# Patient Record
Sex: Male | Born: 1975
Health system: Southern US, Community
[De-identification: ages and names within clinical notes are randomized; demographics above are authoritative.]

## PROBLEM LIST (undated history)

## (undated) DIAGNOSIS — R319 Hematuria, unspecified: Secondary | ICD-10-CM

## (undated) DIAGNOSIS — D494 Neoplasm of unspecified behavior of bladder: Secondary | ICD-10-CM

## (undated) DIAGNOSIS — E119 Type 2 diabetes mellitus without complications: Secondary | ICD-10-CM

## (undated) DIAGNOSIS — D573 Sickle-cell trait: Secondary | ICD-10-CM

## (undated) DIAGNOSIS — T8859XA Other complications of anesthesia, initial encounter: Secondary | ICD-10-CM

## (undated) DIAGNOSIS — I10 Essential (primary) hypertension: Secondary | ICD-10-CM

## (undated) DIAGNOSIS — T4145XA Adverse effect of unspecified anesthetic, initial encounter: Secondary | ICD-10-CM

## (undated) DIAGNOSIS — G4730 Sleep apnea, unspecified: Secondary | ICD-10-CM

## (undated) DIAGNOSIS — K219 Gastro-esophageal reflux disease without esophagitis: Secondary | ICD-10-CM

## (undated) HISTORY — PX: CARDIAC CATHETERIZATION: SHX172

---

## 2006-09-02 HISTORY — PX: TONSILLECTOMY: SUR1361

## 2008-09-02 HISTORY — PX: TRANSURETHRAL RESECTION OF BLADDER TUMOR WITH GYRUS (TURBT-GYRUS): SHX6458

## 2014-01-04 ENCOUNTER — Other Ambulatory Visit: Payer: Self-pay | Admitting: Urology

## 2014-01-11 ENCOUNTER — Encounter (HOSPITAL_COMMUNITY): Payer: Self-pay | Admitting: Pharmacy Technician

## 2014-01-13 ENCOUNTER — Emergency Department (HOSPITAL_COMMUNITY): Payer: Managed Care, Other (non HMO)

## 2014-01-13 ENCOUNTER — Encounter (HOSPITAL_COMMUNITY): Payer: Self-pay

## 2014-01-13 ENCOUNTER — Encounter (HOSPITAL_COMMUNITY): Payer: Self-pay | Admitting: Emergency Medicine

## 2014-01-13 ENCOUNTER — Other Ambulatory Visit (HOSPITAL_COMMUNITY): Payer: Self-pay | Admitting: *Deleted

## 2014-01-13 ENCOUNTER — Encounter (INDEPENDENT_AMBULATORY_CARE_PROVIDER_SITE_OTHER): Payer: Self-pay

## 2014-01-13 ENCOUNTER — Encounter (HOSPITAL_COMMUNITY)
Admission: RE | Admit: 2014-01-13 | Discharge: 2014-01-13 | Disposition: A | Discharge status: Home / Self Care | Payer: Managed Care, Other (non HMO) | Source: Ambulatory Visit | Attending: Urology | Admitting: Urology

## 2014-01-13 ENCOUNTER — Emergency Department (HOSPITAL_COMMUNITY)
Admission: EM | Admit: 2014-01-13 | Discharge: 2014-01-13 | Disposition: A | Discharge status: Home / Self Care | Payer: Managed Care, Other (non HMO) | Attending: Emergency Medicine | Admitting: Emergency Medicine

## 2014-01-13 DIAGNOSIS — Z79899 Other long term (current) drug therapy: Secondary | ICD-10-CM | POA: Insufficient documentation

## 2014-01-13 DIAGNOSIS — I10 Essential (primary) hypertension: Secondary | ICD-10-CM

## 2014-01-13 DIAGNOSIS — Z8719 Personal history of other diseases of the digestive system: Secondary | ICD-10-CM | POA: Insufficient documentation

## 2014-01-13 DIAGNOSIS — G4730 Sleep apnea, unspecified: Secondary | ICD-10-CM | POA: Insufficient documentation

## 2014-01-13 DIAGNOSIS — R350 Frequency of micturition: Secondary | ICD-10-CM | POA: Insufficient documentation

## 2014-01-13 DIAGNOSIS — R109 Unspecified abdominal pain: Secondary | ICD-10-CM | POA: Insufficient documentation

## 2014-01-13 HISTORY — DX: Neoplasm of unspecified behavior of bladder: D49.4

## 2014-01-13 HISTORY — DX: Hematuria, unspecified: R31.9

## 2014-01-13 HISTORY — DX: Essential (primary) hypertension: I10

## 2014-01-13 HISTORY — DX: Other complications of anesthesia, initial encounter: T88.59XA

## 2014-01-13 HISTORY — DX: Gastro-esophageal reflux disease without esophagitis: K21.9

## 2014-01-13 HISTORY — DX: Sleep apnea, unspecified: G47.30

## 2014-01-13 HISTORY — DX: Adverse effect of unspecified anesthetic, initial encounter: T41.45XA

## 2014-01-13 LAB — CBC
HCT: 45.6 % (ref 39.0–52.0)
Hemoglobin: 16.7 g/dL (ref 13.0–17.0)
MCH: 26.9 pg (ref 26.0–34.0)
MCHC: 36.6 g/dL — ABNORMAL HIGH (ref 30.0–36.0)
MCV: 73.4 fL — ABNORMAL LOW (ref 78.0–100.0)
Platelets: 292 10*3/uL (ref 150–400)
RBC: 6.21 MIL/uL — AB (ref 4.22–5.81)
RDW: 14.8 % (ref 11.5–15.5)
WBC: 6.7 10*3/uL (ref 4.0–10.5)

## 2014-01-13 LAB — BASIC METABOLIC PANEL
BUN: 14 mg/dL (ref 6–23)
CO2: 26 mEq/L (ref 19–32)
Calcium: 9.9 mg/dL (ref 8.4–10.5)
Chloride: 101 mEq/L (ref 96–112)
Creatinine, Ser: 1.36 mg/dL — ABNORMAL HIGH (ref 0.50–1.35)
GFR, EST AFRICAN AMERICAN: 74 mL/min — AB (ref >90)
GFR, EST NON AFRICAN AMERICAN: 64 mL/min — AB (ref >90)
Glucose, Bld: 85 mg/dL (ref 70–99)
Potassium: 4.4 mEq/L (ref 3.7–5.3)
SODIUM: 138 meq/L (ref 137–147)

## 2014-01-13 MED ORDER — GENTAMICIN SULFATE 40 MG/ML IJ SOLN
560.0000 mg | INTRAVENOUS | Status: DC
  Filled 2014-01-13: qty 14

## 2014-01-13 MED ORDER — HYDRALAZINE HCL 20 MG/ML IJ SOLN
10.0000 mg | Freq: Once | INTRAMUSCULAR | Status: AC
  Administered 2014-01-13: 10 mg via INTRAVENOUS
  Filled 2014-01-13: qty 0.5

## 2014-01-13 MED ORDER — METOPROLOL TARTRATE 25 MG PO TABS
25.0000 mg | ORAL_TABLET | Freq: Once | ORAL | Status: AC
  Administered 2014-01-13: 25 mg via ORAL
  Filled 2014-01-13: qty 1

## 2014-01-13 NOTE — ED Notes (Addendum)
Pt was being prep for surgery today and in peri-op that noted pt of having HTN. Pt has HTN and took BP meds about 3 hours ago. BP noted was 212/134 and 206/136. Staff did EKG, drew CBC and BMET. Pt denies chest pain or headaches. Wants pt evaluated

## 2014-01-13 NOTE — Progress Notes (Signed)
12:20 PM pt taken to ED at request of Dr. Marcell Barlow when BP was reported to be 206/136 on arrival to PST and at recheck to be 212/134. Pt was alert with discomfort in bladder area no acute distress. Hx obtained / instructions given/  EKG / LABS at PST for surgical procedure in AM.

## 2014-01-13 NOTE — Patient Instructions (Addendum)
Lissandro Oncale  01/13/2014                           YOUR PROCEDURE IS SCHEDULED ON: 01/14/14 AT 10:00 AM               ENTER [place] ENTRANCE AND                                       FOLLOW  SIGNS TO SHORT STAY CENTER                 ARRIVE AT SHORT STAY AT: 8:00 AM               CALL THIS NUMBER IF ANY PROBLEMS THE DAY OF SURGERY :               [phone]                  BRING C PAP MASK AND TUBING                             REMEMBER:   Do not eat food or drink liquids AFTER MIDNIGHT                 Take these medicines the morning of surgery with               A SIPS OF WATER :      AMLODIPINE / METOPROLOL / MINOXIDIL   Do not wear jewelry, make-up   Do not wear lotions, powders, or perfumes.   Do not shave legs or underarms 12 hrs. before surgery (men may shave face)  Do not bring valuables to the hospital.  Contacts, dentures or bridgework may not be worn into surgery.  Leave suitcase in the car. After surgery it may be brought to your room.  For patients admitted to the hospital more than one night, checkout time is            11:00 AM                                                       The day of discharge.   Patients discharged the day of surgery will not be allowed to drive home.            If going home same day of surgery, must have someone stay with you              FIRST 24 hrs at home and arrange for some one to drive you              home from hospital.   ________________________________________________________________________                                                          [facility] - Preparing for Surgery Before surgery, you can play an important role.  Because skin is not sterile, your skin needs  to be as free of germs as possible.  You can reduce the number of germs on your skin by washing with CHG (chlorahexidine gluconate) soap before surgery.  CHG is an antiseptic cleaner which kills germs and bonds  with the skin to continue killing germs even after washing. Please DO NOT use if you have an allergy to CHG or antibacterial soaps.  If your skin becomes reddened/irritated stop using the CHG and inform your nurse when you arrive at Short Stay. Do not shave (including legs and underarms) for at least 48 hours prior to the first CHG shower.  You may shave your face. Please follow these instructions carefully:  1.  Shower with CHG Soap the night before surgery and the  morning of Surgery.   2.  If you choose to wash your hair, wash your hair first as usual with your  normal  Shampoo.   3.  After you shampoo, rinse your hair and body thoroughly to remove the  shampoo.                                         4.  Use CHG as you would any other liquid soap.  You can apply chg directly  to the skin and wash . Gently wash with scrungie or clean wascloth    5.  Apply the CHG Soap to your body ONLY FROM THE NECK DOWN.   Do not use on open                           Wound or open sores. Avoid contact with eyes, ears mouth and genitals (private parts).                        Genitals (private parts) with your normal soap.              6.  Wash thoroughly, paying special attention to the area where your surgery  will be performed.   7.  Thoroughly rinse your body with warm water from the neck down.   8.  DO NOT shower/wash with your normal soap after using and rinsing off  the CHG Soap .                9.  Pat yourself dry with a clean towel.             10.  Wear clean pajamas.             11.  Place clean sheets on your bed the night of your first shower and do not  sleep with pets.  Day of Surgery : Do not apply any lotions/deodorants the morning of surgery.  Please wear clean clothes to the hospital/surgery center.  FAILURE TO FOLLOW THESE INSTRUCTIONS MAY RESULT IN THE CANCELLATION OF YOUR SURGERY    PATIENT SIGNATURE_________________________________

## 2014-01-13 NOTE — ED Provider Notes (Signed)
CSN: [id number]     Arrival date & time 01/13/14  1229 History   First MD Initiated Contact with Patient 01/13/14 1238     Chief Complaint  Patient presents with  . Hypertension     (Consider location/radiation/quality/duration/timing/severity/associated sxs/prior Treatment) Patient is a 38 y.o. male presenting with hypertension. The history is provided by the patient. No language interpreter was used.  Hypertension This is a recurrent problem. Episode onset: unsure. Episode frequency: unsure. Progression since onset: worsening today. Associated symptoms include abdominal pain (has known bladder tumor which is painful. ). Pertinent negatives include no chest pain, no headaches and no shortness of breath. Nothing aggravates the symptoms. Nothing relieves the symptoms. Treatments tried: None. The treatment provided no relief.    Past Medical History  Diagnosis Date  . Complication of anesthesia     SLIGHT NAUSEA  . Hypertension   . GERD (gastroesophageal reflux disease)     RELATED TO CERTAIN FOODS  . Bladder tumor   . Hematuria   . Sleep apnea     USES C PAP   Past Surgical History  Procedure Laterality Date  . Transurethral resection of bladder tumor with gyrus (turbt-gyrus)  2013  . Tonsillectomy  2008   No family history on file. History  Substance Use Topics  . Smoking status: Never Smoker   . Smokeless tobacco: Not on file  . Alcohol Use: Yes     Comment: OCCASIONAL    Review of Systems  Constitutional: Negative for fever, activity change, appetite change and fatigue.  HENT: Negative for congestion, facial swelling, rhinorrhea and trouble swallowing.   Eyes: Negative for photophobia and pain.  Respiratory: Negative for cough, chest tightness and shortness of breath.   Cardiovascular: Negative for chest pain and leg swelling.  Gastrointestinal: Positive for abdominal pain (has known bladder tumor which is painful. ). Negative for nausea, vomiting, diarrhea and  constipation.  Endocrine: Negative for polydipsia and polyuria.  Genitourinary: Negative for dysuria, urgency, decreased urine volume and difficulty urinating.  Musculoskeletal: Negative for back pain and gait problem.  Skin: Negative for color change, rash and wound.  Allergic/Immunologic: Negative for immunocompromised state.  Neurological: Negative for dizziness, facial asymmetry, speech difficulty, weakness, numbness and headaches.  Psychiatric/Behavioral: Negative for confusion, decreased concentration and agitation.      Allergies  Review of patient's allergies indicates no known allergies.  Home Medications   Prior to Admission medications   Medication Sig Start Date End Date Taking? Authorizing Provider  amLODipine (NORVASC) 10 MG tablet Take 10 mg by mouth every morning.    Historical Provider, MD  aspirin-acetaminophen-caffeine (EXCEDRIN MIGRAINE) [phone] MG per tablet Take 1 tablet by mouth every 6 (six) hours as needed for headache.    Historical Provider, MD  hydrochlorothiazide (HYDRODIURIL) 25 MG tablet Take 25 mg by mouth every morning.    Historical Provider, MD  ibuprofen (ADVIL,MOTRIN) 200 MG tablet Take 200-400 mg by mouth every 6 (six) hours as needed for mild pain.    Historical Provider, MD  metoprolol succinate (TOPROL-XL) 50 MG 24 hr tablet Take 50 mg by mouth every morning. Take with or immediately following a meal.    Historical Provider, MD  minoxidil (LONITEN) 2.5 MG tablet Take 2.5 mg by mouth 2 (two) times daily.    Historical Provider, MD   BP 174/110  Pulse 73  Temp(Src) 98 F (36.7 C) (Oral)  Resp 21  SpO2 97% Physical Exam  Constitutional: He is oriented to person, place, and time. He  appears well-developed and well-nourished. No distress.  HENT:  Head: Normocephalic and atraumatic.  Mouth/Throat: No oropharyngeal exudate.  Eyes: Pupils are equal, round, and reactive to light.  Neck: Normal range of motion. Neck supple.  Cardiovascular:  Normal rate, regular rhythm and normal heart sounds.  Exam reveals no gallop and no friction rub.   No murmur heard. Pulmonary/Chest: Effort normal and breath sounds normal. No respiratory distress. He has no wheezes. He has no rales.  Abdominal: Soft. Bowel sounds are normal. He exhibits no distension and no mass. There is no tenderness. There is no rebound and no guarding.  Musculoskeletal: Normal range of motion. He exhibits no edema and no tenderness.  Neurological: He is alert and oriented to person, place, and time.  Skin: Skin is warm and dry.  Psychiatric: He has a normal mood and affect.    ED Course  Procedures (including critical care time) Labs Review Labs Reviewed - No data to display  Imaging Review Dg Chest 2 View  01/13/2014   CLINICAL DATA:  Preop for bladder tumor removal, elevated blood pressure  EXAM: CHEST  2 VIEW  COMPARISON:  None.  FINDINGS: The heart size and mediastinal contours are within normal limits. Both lungs are clear. The visualized skeletal structures are unremarkable.  IMPRESSION: No active cardiopulmonary disease.   Electronically Signed   By: Skipper Cliche M.D.   On: 01/13/2014 13:37     EKG Interpretation None      Date: 01/13/2014  Rate: 76  Rhythm: normal sinus rhythm  QRS Axis: normal  Intervals: normal  ST/T Wave abnormalities: normal  Conduction Disutrbances:LVH  Narrative Interpretation:   Old EKG Reviewed: none available    MDM   Final diagnoses:  Hypertension    Pt is a 38 y.o. male with Pmhx as above who presents with hypertension from pre-op area.  Pt states he has felt fine recently. Denies CP, SOB, LE edema, dec UOP, h/a, visual changes, numbness, weakness. He takes 3 home BP meds which he had taken just prior to pre-op appointment.  EKG w/ LVH which pt reports hx of. CXR nml.  CBC grossly nml. BMP w/ Cr 1.36 (no prior).  IV hydralazine and PO metoprolol given with improvment of BP. Will d/d home w/ instructions to monitor  BP at home, call PCP for follow up appt. Return precautions given for new or worsening symptoms including CP, SOB, numbness, weakness.           Neta Ehlers, MD 01/13/14 1410

## 2014-01-13 NOTE — Discharge Instructions (Signed)
Arterial Hypertension °Arterial hypertension (high blood pressure) is a condition of elevated pressure in your blood vessels. Hypertension over a long period of time is a risk factor for strokes, heart attacks, and heart failure. It is also the leading cause of kidney (renal) failure.  °CAUSES  °· In Adults -- Over 90% of all hypertension has no known cause. This is called essential or primary hypertension. In the other 10% of people with hypertension, the increase in blood pressure is caused by another disorder. This is called secondary hypertension. Important causes of secondary hypertension are: °· Heavy alcohol use. °· Obstructive sleep apnea. °· Hyperaldosterosim (Conn's syndrome). °· Steroid use. °· Chronic kidney failure. °· Hyperparathyroidism. °· Medications. °· Renal artery stenosis. °· Pheochromocytoma. °· Cushing's disease. °· Coarctation of the aorta. °· Scleroderma renal crisis. °· Licorice (in excessive amounts). °· Drugs (cocaine, methamphetamine). °Your caregiver can explain any items above that apply to you. °· In Children -- Secondary hypertension is more common and should always be considered. °· Pregnancy -- Few women of childbearing age have high blood pressure. However, up to 10% of them develop hypertension of pregnancy. Generally, this will not harm the woman. It may be a sign of 3 complications of pregnancy: preeclampsia, HELLP syndrome, and eclampsia. Follow up and control with medication is necessary. °SYMPTOMS  °· This condition normally does not produce any noticeable symptoms. It is usually found during a routine exam. °· Malignant hypertension is a late problem of high blood pressure. It may have the following symptoms: °· Headaches. °· Blurred vision. °· End-organ damage (this means your kidneys, heart, lungs, and other organs are being damaged). °· Stressful situations can increase the blood pressure. If a person with normal blood pressure has their blood pressure go up while being  seen by their caregiver, this is often termed "white coat hypertension." Its importance is not known. It may be related with eventually developing hypertension or complications of hypertension. °· Hypertension is often confused with mental tension, stress, and anxiety. °DIAGNOSIS  °The diagnosis is made by 3 separate blood pressure measurements. They are taken at least 1 week apart from each other. If there is organ damage from hypertension, the diagnosis may be made without repeat measurements. °Hypertension is usually identified by having blood pressure readings: °· Above 140/90 mmHg measured in both arms, at 3 separate times, over a couple weeks. °· Over 130/80 mmHg should be considered a risk factor and may require treatment in patients with diabetes. °Blood pressure readings over 120/80 mmHg are called "pre-hypertension" even in non-diabetic patients. °To get a true blood pressure measurement, use the following guidelines. Be aware of the factors that can alter blood pressure readings. °· Take measurements at least 1 hour after caffeine. °· Take measurements 30 minutes after smoking and without any stress. This is another reason to quit smoking  it raises your blood pressure. °· Use a proper cuff size. Ask your caregiver if you are not sure about your cuff size. °· Most home blood pressure cuffs are automatic. They will measure systolic and diastolic pressures. The systolic pressure is the pressure reading at the start of sounds. Diastolic pressure is the pressure at which the sounds disappear. If you are elderly, measure pressures in multiple postures. Try sitting, lying or standing. °· Sit at rest for a minimum of 5 minutes before taking measurements. °· You should not be on any medications like decongestants. These are found in many cold medications. °· Record your blood pressure readings and review   them with your caregiver. °If you have hypertension: °· Your caregiver may do tests to be sure you do not have  secondary hypertension (see "causes" above). °· Your caregiver may also look for signs of metabolic syndrome. This is also called Syndrome X or Insulin Resistance Syndrome. You may have this syndrome if you have type 2 diabetes, abdominal obesity, and abnormal blood lipids in addition to hypertension. °· Your caregiver will take your medical and family history and perform a physical exam. °· Diagnostic tests may include blood tests (for glucose, cholesterol, potassium, and kidney function), a urinalysis, or an EKG. Other tests may also be necessary depending on your condition. °PREVENTION  °There are important lifestyle issues that you can adopt to reduce your chance of developing hypertension: °· Maintain a normal weight. °· Limit the amount of salt (sodium) in your diet. °· Exercise often. °· Limit alcohol intake. °· Get enough potassium in your diet. Discuss specific advice with your caregiver. °· Follow a DASH diet (dietary approaches to stop hypertension). This diet is rich in fruits, vegetables, and low-fat dairy products, and avoids certain fats. °PROGNOSIS  °Essential hypertension cannot be cured. Lifestyle changes and medical treatment can lower blood pressure and reduce complications. The prognosis of secondary hypertension depends on the underlying cause. Many people whose hypertension is controlled with medicine or lifestyle changes can live a normal, healthy life.  °RISKS AND COMPLICATIONS  °While high blood pressure alone is not an illness, it often requires treatment due to its short- and long-term effects on many organs. Hypertension increases your risk for: °· CVAs or strokes (cerebrovascular accident). °· Heart failure due to chronically high blood pressure (hypertensive cardiomyopathy). °· Heart attack (myocardial infarction). °· Damage to the retina (hypertensive retinopathy). °· Kidney failure (hypertensive nephropathy). °Your caregiver can explain list items above that apply to you. Treatment  of hypertension can significantly reduce the risk of complications. °TREATMENT  °· For overweight patients, weight loss and regular exercise are recommended. Physical fitness lowers blood pressure. °· Mild hypertension is usually treated with diet and exercise. A diet rich in fruits and vegetables, fat-free dairy products, and foods low in fat and salt (sodium) can help lower blood pressure. Decreasing salt intake decreases blood pressure in a 1/3 of people. °· Stop smoking if you are a smoker. °The steps above are highly effective in reducing blood pressure. While these actions are easy to suggest, they are difficult to achieve. Most patients with moderate or severe hypertension end up requiring medications to bring their blood pressure down to a normal level. There are several classes of medications for treatment. Blood pressure pills (antihypertensives) will lower blood pressure by their different actions. Lowering the blood pressure by 10 mmHg may decrease the risk of complications by as much as 25%. °The goal of treatment is effective blood pressure control. This will reduce your risk for complications. Your caregiver will help you determine the best treatment for you according to your lifestyle. What is excellent treatment for one person, may not be for you. °HOME CARE INSTRUCTIONS  °· Do not smoke. °· Follow the lifestyle changes outlined in the "Prevention" section. °· If you are on medications, follow the directions carefully. Blood pressure medications must be taken as prescribed. Skipping doses reduces their benefit. It also puts you at risk for problems. °· Follow up with your caregiver, as directed. °· If you are asked to monitor your blood pressure at home, follow the guidelines in the "Diagnosis" section above. °SEEK MEDICAL CARE   IF:   You think you are having medication side effects.  You have recurrent headaches or lightheadedness.  You have swelling in your ankles.  You have trouble with  your vision. SEEK IMMEDIATE MEDICAL CARE IF:   You have sudden onset of chest pain or pressure, difficulty breathing, or other symptoms of a heart attack.  You have a severe headache.  You have symptoms of a stroke (such as sudden weakness, difficulty speaking, difficulty walking). MAKE SURE YOU:   Understand these instructions.  Will watch your condition.  Will get help right away if you are not doing well or get worse. Document Released: 08/19/2005 Document Revised: 11/11/2011 Document Reviewed: 03/19/2007 [facility] Patient Information 2014 [place].  How to Take Your Blood Pressure  These instructions are only for electronic home blood pressure machines. You will need:   An automatic or semi-automatic blood pressure machine.  Fresh batteries for the blood pressure machine. HOW DO I USE THESE TOOLS TO CHECK MY BLOOD PRESSURE?   There are 2 numbers that make up your blood pressure. For example: 120/80.  The first number (120 in our example) is called the "systolic pressure." It is a measure of the pressure in your blood vessels when your heart is pumping blood.  The second number (80 in our example) is called the "diastolic pressure." It is a measure of the pressure in your blood vessels when your heart is resting between beats.  Before you buy a home blood pressure machine, check the size of your arm so you can buy the right size cuff. Here is how to check the size of your arm:  Use a tape measure that shows both inches and centimeters.  Wrap the tape measure around the middle upper part of your arm. You may need someone to help you measure right.  Write down your arm measurement in both inches and centimeters.  To measure your blood pressure right, it is important to have the right size cuff.  If your arm is up to 13 inches (37 to 34 centimeters), get an adult cuff size.  If your arm is 13 to 17 inches (35 to 44 centimeters), get a large adult cuff size.  If  your arm is 17 to 20 inches (45 to 52 centimeters), get an adult thigh cuff.  Try to rest or relax for at least 30 minutes before you check your blood pressure.  Do not smoke.  Do not have any drinks with caffeine, such as:  Pop.  Coffee.  Tea.  Check your blood pressure in a quiet room.  Sit down and stretch out your arm on a table. Keep your arm at about the level of your heart. Let your arm relax. GETTING BLOOD PRESSURE READINGS  Make sure you remove any tight-fighting clothing from your arm. Wrap the cuff around your upper arm. Wrap it just above the bend, and above where you felt the pulse. You should be able to slip a finger between the cuff and your arm. If you cannot slip a finger in the cuff, it is too tight and should be removed and rewrapped.  Some units requires you to manually pump up the arm cuff.  Automatic units inflate the cuff when you press a button.  Cuff deflation is automatic in both models.  After the cuff is inflated, the unit measures your blood pressure and pulse. The readings are displayed on a monitor. Hold still and breathe normally while the cuff is inflated.  Getting a  reading takes less than a minute.  Some models store readings in a memory. Some provide a printout of readings.  Get readings at different times of the day. You should wait at least 5 minutes between readings. Take readings with you to your next doctor's visit. Document Released: 08/01/2008 Document Revised: 11/11/2011 Document Reviewed: 08/01/2008 [facility] Patient Information 2014 [place].

## 2014-01-13 NOTE — Progress Notes (Signed)
  CARE MANAGEMENT ED NOTE 01/13/2014  Patient:  Ricky Moore, Ricky Moore   Account Number:  [id number]  Date Initiated:  01/13/2014  Documentation initiated by:  Jackelyn Poling  Subjective/Objective Assessment:   38 yr old [employer] managed pt from [place] with elevated bp confirms pcp in [place] as dr blodegett     Subjective/Objective Assessment Detail:     Action/Plan:   epic updated   Action/Plan Detail:   Anticipated DC Date:  01/13/2014     Status Recommendation to Physician:   Result of Recommendation:    Other ED [place]  Other  PCP issues  Outpatient Services - Pt will follow up    Choice offered to / List presented to:            Status of service:  Completed, signed off  ED Comments:   ED Comments Detail:

## 2014-01-14 ENCOUNTER — Encounter (HOSPITAL_COMMUNITY): Payer: Self-pay | Admitting: Anesthesiology

## 2014-01-14 ENCOUNTER — Encounter (HOSPITAL_COMMUNITY)
Admission: RE | Disposition: A | Discharge status: Home / Self Care | Payer: Self-pay | Source: Ambulatory Visit | Attending: Urology

## 2014-01-14 ENCOUNTER — Ambulatory Visit (HOSPITAL_COMMUNITY)
Admission: RE | Admit: 2014-01-14 | Discharge: 2014-01-14 | Disposition: A | Discharge status: Home / Self Care | Payer: Managed Care, Other (non HMO) | Source: Ambulatory Visit | Attending: Urology | Admitting: Urology

## 2014-01-14 ENCOUNTER — Encounter (HOSPITAL_COMMUNITY): Payer: Self-pay | Admitting: *Deleted

## 2014-01-14 DIAGNOSIS — I10 Essential (primary) hypertension: Secondary | ICD-10-CM | POA: Insufficient documentation

## 2014-01-14 DIAGNOSIS — Z5309 Procedure and treatment not carried out because of other contraindication: Secondary | ICD-10-CM | POA: Insufficient documentation

## 2014-01-14 DIAGNOSIS — E669 Obesity, unspecified: Secondary | ICD-10-CM | POA: Insufficient documentation

## 2014-01-14 DIAGNOSIS — R109 Unspecified abdominal pain: Secondary | ICD-10-CM | POA: Insufficient documentation

## 2014-01-14 DIAGNOSIS — G4730 Sleep apnea, unspecified: Secondary | ICD-10-CM | POA: Insufficient documentation

## 2014-01-14 DIAGNOSIS — R310 Gross hematuria: Secondary | ICD-10-CM | POA: Insufficient documentation

## 2014-01-14 DIAGNOSIS — K219 Gastro-esophageal reflux disease without esophagitis: Secondary | ICD-10-CM | POA: Insufficient documentation

## 2014-01-14 SURGERY — TRANSURETHRAL RESECTION OF BLADDER TUMOR WITH GYRUS (TURBT-GYRUS)
Anesthesia: General

## 2014-01-14 MED ORDER — LIDOCAINE HCL (CARDIAC) 20 MG/ML IV SOLN
INTRAVENOUS | Status: AC
  Filled 2014-01-14: qty 5

## 2014-01-14 MED ORDER — MIDAZOLAM HCL 2 MG/2ML IJ SOLN
INTRAMUSCULAR | Status: AC
  Filled 2014-01-14: qty 2

## 2014-01-14 MED ORDER — PROPOFOL 10 MG/ML IV BOLUS
INTRAVENOUS | Status: AC
  Filled 2014-01-14: qty 20

## 2014-01-14 MED ORDER — FENTANYL CITRATE 0.05 MG/ML IJ SOLN
INTRAMUSCULAR | Status: AC
  Filled 2014-01-14: qty 2

## 2014-01-14 NOTE — H&P (Signed)
Ricky Moore is an 38 y.o. male.   Chief Complaint: PRe-Op Transurethral resection BLadder Mass HPI:   1 - Gross Hematuria / "Benign Bladder Lesions" - Pt with h/o "benign bladder lesions" s/p biopsy / resection 2013 in [place] found on w/u gross hematuria. Non smoker. No dye / chemical exposure.  CT Urogram 12/2013 with possible posterior bladder wall thickening, cysto with unusual nodular posterior bladder tissue.  2 - Severe Hypertension - 37md or more HTN for many years with DBP >100 on meds per report. CT 12/2013 w/o adrenal mass or obvious renal artery lesions. Serum cortisol and metanephrines normal 2015.   3 - Pelvic Pain / Pressure - Pt with several mos of increasing pelvic pressure, has correlated with hematuria episodes. DRE 12/2013 30gm, PVR 12/2013 "0 mL"  PMH sig for  obesity, severe HTN[place]   Today TKeesis seen to proceed with transurethral resection of his unusual bladder lesions. His HTN has been problematic, but he is at baseline and denies headache, CP, palpicaitons.   Past Medical History  Diagnosis Date  . Complication of anesthesia     SLIGHT NAUSEA  . Hypertension   . GERD (gastroesophageal reflux disease)     RELATED TO CERTAIN FOODS  . Bladder tumor   . Hematuria   . Sleep apnea     USES C PAP    Past Surgical History  Procedure Laterality Date  . Transurethral resection of bladder tumor with gyrus (turbt-gyrus)  2013  . Tonsillectomy  2008    No family history on file. Social History:  reports that he has never smoked. He does not have any smokeless tobacco history on file. He reports that he drinks alcohol. He reports that he does not use illicit drugs.  Allergies: No Known Allergies  No prescriptions prior to admission    Results for orders placed during the hospital encounter of 01/13/14 (from the past 48 hour(s))  BASIC METABOLIC PANEL     Status: Abnormal   Collection Time    01/13/14 12:20 PM      Result Value Ref Range   Sodium 138  137 - 147 mEq/L   Potassium 4.4  3.7 - 5.3 mEq/L   Chloride 101  96 - 112 mEq/L   CO2 26  19 - 32 mEq/L   Glucose, Bld 85  70 - 99 mg/dL   BUN 14  6 - 23 mg/dL   Creatinine, Ser 1.36 (*) 0.50 - 1.35 mg/dL   Calcium 9.9  8.4 - 10.5 mg/dL   GFR calc non Af Amer 64 (*) >90 mL/min   GFR calc Af Amer 74 (*) >90 mL/min   Comment: (NOTE)     The eGFR has been calculated using the CKD EPI equation.     This calculation has not been validated in all clinical situations.     eGFR's persistently <90 mL/min signify possible Chronic Kidney     Disease.  CBC     Status: Abnormal   Collection Time    01/13/14 12:20 PM      Result Value Ref Range   WBC 6.7  4.0 - 10.5 K/uL   RBC 6.21 (*) 4.22 - 5.81 MIL/uL   Hemoglobin 16.7  13.0 - 17.0 g/dL   HCT 45.6  39.0 - 52.0 %   MCV 73.4 (*) 78.0 - 100.0 fL   MCH 26.9  26.0 - 34.0 pg   MCHC  36.6 (*) 30.0 - 36.0 g/dL   RDW 14.8  11.5 - 15.5 %   Platelets 292  150 - 400 K/uL   Dg Chest 2 View  01/13/2014   CLINICAL DATA:  Preop for bladder tumor removal, elevated blood pressure  EXAM: CHEST  2 VIEW  COMPARISON:  None.  FINDINGS: The heart size and mediastinal contours are within normal limits. Both lungs are clear. The visualized skeletal structures are unremarkable.  IMPRESSION: No active cardiopulmonary disease.   Electronically Signed   By: Skipper Cliche M.D.   On: 01/13/2014 13:37    Review of Systems  Constitutional: Negative.  Negative for fever and chills.  HENT: Negative.   Eyes: Negative.   Respiratory: Negative.   Cardiovascular: Negative.   Gastrointestinal: Negative.  Negative for nausea and vomiting.  Genitourinary: Negative.   Musculoskeletal: Negative.   Skin: Negative.   Neurological: Negative.  Negative for dizziness, tingling, sensory change, speech change and loss of consciousness.  Endo/Heme/Allergies: Negative.   Psychiatric/Behavioral: Negative.      There were no vitals taken for this visit. Physical Exam  Constitutional: He appears well-developed and well-nourished.  HENT:  Head: Normocephalic and atraumatic.  Eyes: EOM are normal. Pupils are equal, round, and reactive to light.  Neck: Normal range of motion. Neck supple.  Cardiovascular: Normal rate and regular rhythm.   Respiratory: Effort normal.  GI: Soft.  Genitourinary: Penis normal.  Musculoskeletal: Normal range of motion.  Neurological: He is alert.  Skin: Skin is warm and dry.  Psychiatric: He has a normal mood and affect. His behavior is normal. Judgment and thought content normal.     Assessment/Plan       1 - Gross Hematuria / "Benign Bladder Lesions" - Eval with imaging, exam, labs, CT, cysto with unusual nodular tissue at intertrigone area. Given hematuria and sympotms I feel TURBT warranted to at a minimum rule out malignancy.   We discussed operative biopsy / transurethral resection as the best next step for diagnostic and therapeutic purposes with goals being to remove all visible cancer and obtain tissue for pathologic exam. We discussed that for some low-grade tumors, this may be all the treatment required, but that for many other tumors such as high-grade lesions, further therapy including surgery and or chemotherapy may be warranted. We also outlined the fact that any bladder cancer diagnosis will require close follow-up with periodic upper and lower tract evaluation. We discussed risks including bleeding, infection, damage to kidney / ureter / bladder including bladder perforation which can typically managed with prolonged foley catheterization. We mentioned anesthetic and other rare risks including DVT, PE, MI, and mortality. I also mentioned that adjunctive procedures such as ureteral stenting, retrograde pyelography, and ureteroscopy may be necessary to fully evaluate the urinary tract depending on intra-operative findings. After answering all questions to  the patient's satisfaction, they wish to proceed.    2 - Severe Hypertension - recent CT without obvious renal vascular or adrenal lesions. Suspect severe primary HTN, per PCP. Random cortisol and metanephrines unremarkable.   3 - Pelvic Pain / Pressure - Suspect bladder lesion contributory, TURBT as per above.  Alexis Frock 01/14/2014, 7:26 AM

## 2014-01-14 NOTE — Anesthesia Preprocedure Evaluation (Deleted)
Anesthesia Evaluation    Airway       Dental   Pulmonary          Cardiovascular hypertension, Pt. on medications and Pt. on home beta blockers     Neuro/Psych    GI/Hepatic   Endo/Other    Renal/GU      Musculoskeletal   Abdominal   Peds  Hematology   Anesthesia Other Findings   Reproductive/Obstetrics                           Anesthesia Physical Anesthesia Plan  ASA: III  Anesthesia Plan: General   Post-op Pain Management:    Induction: Intravenous  Airway Management Planned:   Additional Equipment:   Intra-op Plan:   Post-operative Plan: Extubation in OR  Informed Consent: I have reviewed the patients History and Physical, chart, labs and discussed the procedure including the risks, benefits and alternatives for the proposed anesthesia with the patient or authorized representative who has indicated his/her understanding and acceptance.   Dental advisory given  Plan Discussed with: CRNA  Anesthesia Plan Comments: (Mr. Hyland was in the ER yesterday with very high BP. He took 2 of his four BP medicines today. BP still quite high. Concerning is the fact is that he has no local primary MD. Dr. Tresa Moore has worked him up for pheochromocytoma, but Mr. Menta has not seen a primary MD since August in [place]. This procedure is not urgent. Discussed with Dr. Tresa Moore and patient to find a local MD and at next presentation to also take his minoxidil on the day of surgery. Cancelled for today.)        Anesthesia Quick Evaluation

## 2014-01-14 NOTE — Progress Notes (Signed)
Discharged via ambulatory with girlfriend to go home

## 2014-01-14 NOTE — Progress Notes (Signed)
Dr. Delma Post in to see patient and surgery cancelled due to hypertension. Patient instructed that Dr. Tresa Moore will call him later today. To set him up with a primary care physician.

## 2014-01-14 NOTE — Progress Notes (Signed)
Patient was instructed to take his hydrochlorothiazide and Minoxidil when he gets home

## 2014-07-14 ENCOUNTER — Inpatient Hospital Stay (HOSPITAL_BASED_OUTPATIENT_CLINIC_OR_DEPARTMENT_OTHER)
Admission: EM | Admit: 2014-07-14 | Discharge: 2014-07-16 | DRG: 639 | Disposition: A | Discharge status: Home / Self Care | Payer: Managed Care, Other (non HMO) | Attending: Internal Medicine | Admitting: Internal Medicine

## 2014-07-14 ENCOUNTER — Encounter (HOSPITAL_BASED_OUTPATIENT_CLINIC_OR_DEPARTMENT_OTHER): Payer: Self-pay | Admitting: *Deleted

## 2014-07-14 DIAGNOSIS — N189 Chronic kidney disease, unspecified: Secondary | ICD-10-CM | POA: Diagnosis present

## 2014-07-14 DIAGNOSIS — G4730 Sleep apnea, unspecified: Secondary | ICD-10-CM | POA: Diagnosis present

## 2014-07-14 DIAGNOSIS — N3289 Other specified disorders of bladder: Secondary | ICD-10-CM | POA: Diagnosis present

## 2014-07-14 DIAGNOSIS — Z79899 Other long term (current) drug therapy: Secondary | ICD-10-CM | POA: Diagnosis not present

## 2014-07-14 DIAGNOSIS — E1122 Type 2 diabetes mellitus with diabetic chronic kidney disease: Secondary | ICD-10-CM

## 2014-07-14 DIAGNOSIS — Z23 Encounter for immunization: Secondary | ICD-10-CM | POA: Diagnosis not present

## 2014-07-14 DIAGNOSIS — K219 Gastro-esophageal reflux disease without esophagitis: Secondary | ICD-10-CM | POA: Diagnosis present

## 2014-07-14 DIAGNOSIS — D494 Neoplasm of unspecified behavior of bladder: Secondary | ICD-10-CM | POA: Diagnosis present

## 2014-07-14 DIAGNOSIS — Z794 Long term (current) use of insulin: Secondary | ICD-10-CM

## 2014-07-14 DIAGNOSIS — R739 Hyperglycemia, unspecified: Secondary | ICD-10-CM | POA: Diagnosis present

## 2014-07-14 DIAGNOSIS — I129 Hypertensive chronic kidney disease with stage 1 through stage 4 chronic kidney disease, or unspecified chronic kidney disease: Secondary | ICD-10-CM | POA: Diagnosis present

## 2014-07-14 DIAGNOSIS — I10 Essential (primary) hypertension: Secondary | ICD-10-CM | POA: Diagnosis present

## 2014-07-14 DIAGNOSIS — E119 Type 2 diabetes mellitus without complications: Secondary | ICD-10-CM

## 2014-07-14 DIAGNOSIS — N139 Obstructive and reflux uropathy, unspecified: Secondary | ICD-10-CM | POA: Diagnosis present

## 2014-07-14 DIAGNOSIS — N179 Acute kidney failure, unspecified: Secondary | ICD-10-CM

## 2014-07-14 DIAGNOSIS — E1142 Type 2 diabetes mellitus with diabetic polyneuropathy: Secondary | ICD-10-CM

## 2014-07-14 DIAGNOSIS — E1165 Type 2 diabetes mellitus with hyperglycemia: Principal | ICD-10-CM | POA: Diagnosis present

## 2014-07-14 DIAGNOSIS — Z833 Family history of diabetes mellitus: Secondary | ICD-10-CM

## 2014-07-14 DIAGNOSIS — N183 Chronic kidney disease, stage 3 (moderate): Secondary | ICD-10-CM

## 2014-07-14 LAB — CBC WITH DIFFERENTIAL/PLATELET
Basophils Absolute: 0 10*3/uL (ref 0.0–0.1)
Basophils Relative: 0 % (ref 0–1)
EOS PCT: 1 % (ref 0–5)
Eosinophils Absolute: 0.1 10*3/uL (ref 0.0–0.7)
HCT: 43.8 % (ref 39.0–52.0)
Hemoglobin: 16.5 g/dL (ref 13.0–17.0)
Lymphocytes Relative: 22 % (ref 12–46)
Lymphs Abs: 1.7 10*3/uL (ref 0.7–4.0)
MCH: 27.2 pg (ref 26.0–34.0)
MCHC: 37.7 g/dL — AB (ref 30.0–36.0)
MCV: 72.2 fL — ABNORMAL LOW (ref 78.0–100.0)
MONO ABS: 0.5 10*3/uL (ref 0.1–1.0)
Monocytes Relative: 7 % (ref 3–12)
NEUTROS PCT: 70 % (ref 43–77)
Neutro Abs: 5.3 10*3/uL (ref 1.7–7.7)
Platelets: 247 10*3/uL (ref 150–400)
RBC: 6.07 MIL/uL — AB (ref 4.22–5.81)
RDW: 14.1 % (ref 11.5–15.5)
WBC: 7.6 10*3/uL (ref 4.0–10.5)

## 2014-07-14 LAB — URINALYSIS, ROUTINE W REFLEX MICROSCOPIC
Bilirubin Urine: NEGATIVE
Hgb urine dipstick: NEGATIVE
Ketones, ur: 15 mg/dL — AB
LEUKOCYTES UA: NEGATIVE
NITRITE: NEGATIVE
PH: 5 (ref 5.0–8.0)
Protein, ur: NEGATIVE mg/dL
Specific Gravity, Urine: 1.036 — ABNORMAL HIGH (ref 1.005–1.030)
Urobilinogen, UA: 0.2 mg/dL (ref 0.0–1.0)

## 2014-07-14 LAB — GLUCOSE, CAPILLARY: GLUCOSE-CAPILLARY: 327 mg/dL — AB (ref 70–99)

## 2014-07-14 LAB — I-STAT VENOUS BLOOD GAS, ED
Acid-base deficit: 1 mmol/L (ref 0.0–2.0)
BICARBONATE: 25.3 meq/L — AB (ref 20.0–24.0)
O2 Saturation: 72 %
PH VEN: 7.336 — AB (ref 7.250–7.300)
PO2 VEN: 40 mmHg (ref 30.0–45.0)
Patient temperature: 98.6
TCO2: 27 mmol/L (ref 0–100)
pCO2, Ven: 47.4 mmHg (ref 45.0–50.0)

## 2014-07-14 LAB — CBG MONITORING, ED
GLUCOSE-CAPILLARY: 574 mg/dL — AB (ref 70–99)
GLUCOSE-CAPILLARY: 461 mg/dL — AB (ref 70–99)
GLUCOSE-CAPILLARY: 347 mg/dL — AB (ref 70–99)
Glucose-Capillary: 299 mg/dL — ABNORMAL HIGH (ref 70–99)

## 2014-07-14 LAB — COMPREHENSIVE METABOLIC PANEL
ALT: 46 U/L (ref 0–53)
ALT: 41 U/L (ref 0–53)
AST: 26 U/L (ref 0–37)
AST: 22 U/L (ref 0–37)
Albumin: 4.5 g/dL (ref 3.5–5.2)
Albumin: 4.1 g/dL (ref 3.5–5.2)
Alkaline Phosphatase: 98 U/L (ref 39–117)
Alkaline Phosphatase: 90 U/L (ref 39–117)
Anion gap: 15 (ref 5–15)
Anion gap: 16 — ABNORMAL HIGH (ref 5–15)
BILIRUBIN TOTAL: 0.8 mg/dL (ref 0.3–1.2)
BUN: 22 mg/dL (ref 6–23)
BUN: 19 mg/dL (ref 6–23)
CALCIUM: 10.2 mg/dL (ref 8.4–10.5)
CALCIUM: 9.7 mg/dL (ref 8.4–10.5)
CHLORIDE: 91 meq/L — AB (ref 96–112)
CO2: 24 mEq/L (ref 19–32)
CO2: 26 mEq/L (ref 19–32)
CREATININE: 1.4 mg/dL — AB (ref 0.50–1.35)
CREATININE: 1.24 mg/dL (ref 0.50–1.35)
Chloride: 94 mEq/L — ABNORMAL LOW (ref 96–112)
GFR calc Af Amer: 83 mL/min — ABNORMAL LOW (ref >90)
GFR calc non Af Amer: 62 mL/min — ABNORMAL LOW (ref >90)
GFR calc non Af Amer: 71 mL/min — ABNORMAL LOW (ref >90)
GFR, EST AFRICAN AMERICAN: 71 mL/min — AB (ref >90)
GLUCOSE: 601 mg/dL — AB (ref 70–99)
Glucose, Bld: 321 mg/dL — ABNORMAL HIGH (ref 70–99)
Potassium: 5.3 mEq/L (ref 3.7–5.3)
Potassium: 3.5 mEq/L — ABNORMAL LOW (ref 3.7–5.3)
SODIUM: 136 meq/L — AB (ref 137–147)
Sodium: 130 mEq/L — ABNORMAL LOW (ref 137–147)
Total Bilirubin: 0.6 mg/dL (ref 0.3–1.2)
Total Protein: 8.2 g/dL (ref 6.0–8.3)
Total Protein: 7.5 g/dL (ref 6.0–8.3)

## 2014-07-14 LAB — URINE MICROSCOPIC-ADD ON

## 2014-07-14 MED ORDER — ASPIRIN-ACETAMINOPHEN-CAFFEINE 250-250-65 MG PO TABS
1.0000 | ORAL_TABLET | Freq: Four times a day (QID) | ORAL | Status: DC | PRN
  Filled 2014-07-14: qty 1

## 2014-07-14 MED ORDER — ACETAMINOPHEN 650 MG RE SUPP: 650.0000 mg | Freq: Four times a day (QID) | RECTAL | Status: DC | PRN

## 2014-07-14 MED ORDER — SODIUM CHLORIDE 0.9 % IV SOLN
INTRAVENOUS | Status: DC
  Administered 2014-07-14: 16:00:00 via INTRAVENOUS

## 2014-07-14 MED ORDER — METOPROLOL SUCCINATE ER 50 MG PO TB24: 50.0000 mg | ORAL_TABLET | Freq: Every morning | ORAL | Status: DC

## 2014-07-14 MED ORDER — SODIUM CHLORIDE 0.9 % IV BOLUS (SEPSIS)
1000.0000 mL | INTRAVENOUS | Status: AC
  Administered 2014-07-14: 1000 mL via INTRAVENOUS

## 2014-07-14 MED ORDER — SODIUM CHLORIDE 0.9 % IV SOLN
INTRAVENOUS | Status: DC
Start: 2014-07-14 — End: 2014-07-16
  Administered 2014-07-14: 75 mL/h via INTRAVENOUS
  Administered 2014-07-15 – 2014-07-16 (×2): via INTRAVENOUS

## 2014-07-14 MED ORDER — INSULIN REGULAR HUMAN 100 UNIT/ML IJ SOLN
15.0000 [IU] | Freq: Once | INTRAMUSCULAR | Status: AC
  Administered 2014-07-14: 15 [IU] via SUBCUTANEOUS
  Filled 2014-07-14: qty 1

## 2014-07-14 MED ORDER — AMLODIPINE BESYLATE 10 MG PO TABS
10.0000 mg | ORAL_TABLET | Freq: Every morning | ORAL | Status: DC
  Administered 2014-07-15: 10 mg via ORAL
  Filled 2014-07-14: qty 1

## 2014-07-14 MED ORDER — INSULIN ASPART 100 UNIT/ML ~~LOC~~ SOLN
0.0000 [IU] | Freq: Every day | SUBCUTANEOUS | Status: DC
  Administered 2014-07-14: 4 [IU] via SUBCUTANEOUS
  Administered 2014-07-15: 3 [IU] via SUBCUTANEOUS

## 2014-07-14 MED ORDER — INSULIN GLARGINE 100 UNIT/ML ~~LOC~~ SOLN
8.0000 [IU] | Freq: Every day | SUBCUTANEOUS | Status: DC
Start: 2014-07-14 — End: 2014-07-15
  Administered 2014-07-14: 8 [IU] via SUBCUTANEOUS
  Filled 2014-07-14 (×3): qty 0.08

## 2014-07-14 MED ORDER — METOPROLOL SUCCINATE ER 50 MG PO TB24
50.0000 mg | ORAL_TABLET | Freq: Every morning | ORAL | Status: DC
  Administered 2014-07-14 – 2014-07-16 (×3): 50 mg via ORAL
  Filled 2014-07-14 (×4): qty 1

## 2014-07-14 MED ORDER — AMLODIPINE BESYLATE 10 MG PO TABS: 10.0000 mg | ORAL_TABLET | Freq: Every morning | ORAL | Status: DC

## 2014-07-14 MED ORDER — ONDANSETRON HCL 4 MG/2ML IJ SOLN
4.0000 mg | Freq: Once | INTRAMUSCULAR | Status: AC
  Administered 2014-07-14: 4 mg via INTRAVENOUS
  Filled 2014-07-14: qty 2

## 2014-07-14 MED ORDER — HYDRALAZINE HCL 20 MG/ML IJ SOLN
10.0000 mg | INTRAMUSCULAR | Status: DC | PRN
  Administered 2014-07-14: 10 mg via INTRAVENOUS
  Filled 2014-07-14: qty 1

## 2014-07-14 MED ORDER — ACETAMINOPHEN 325 MG PO TABS: 650.0000 mg | ORAL_TABLET | Freq: Four times a day (QID) | ORAL | Status: DC | PRN

## 2014-07-14 MED ORDER — ONDANSETRON HCL 4 MG PO TABS: 4.0000 mg | ORAL_TABLET | Freq: Four times a day (QID) | ORAL | Status: DC | PRN

## 2014-07-14 MED ORDER — ONDANSETRON HCL 4 MG/2ML IJ SOLN: 4.0000 mg | Freq: Four times a day (QID) | INTRAMUSCULAR | Status: DC | PRN

## 2014-07-14 MED ORDER — INSULIN ASPART 100 UNIT/ML ~~LOC~~ SOLN
0.0000 [IU] | Freq: Three times a day (TID) | SUBCUTANEOUS | Status: DC
  Administered 2014-07-15: 11 [IU] via SUBCUTANEOUS
  Administered 2014-07-15: 8 [IU] via SUBCUTANEOUS
  Administered 2014-07-15: 11 [IU] via SUBCUTANEOUS
  Administered 2014-07-16 (×2): 8 [IU] via SUBCUTANEOUS

## 2014-07-14 MED ORDER — HEPARIN SODIUM (PORCINE) 5000 UNIT/ML IJ SOLN
5000.0000 [IU] | Freq: Three times a day (TID) | INTRAMUSCULAR | Status: DC
  Administered 2014-07-14 – 2014-07-16 (×5): 5000 [IU] via SUBCUTANEOUS
  Filled 2014-07-14 (×9): qty 1

## 2014-07-14 NOTE — Progress Notes (Signed)
Patient presents to [facility] urgent care with complaints of polydipsia and polyuria for few days, no previous history of diabetes only hypertension, was found to have glucosuria on urinalysis, BMP done showing glucose of 600, but not in DKA, was given 2 L of fluids, and 15 units of insulin,. [facility] hospitalist requested to admit for acute renal failure as a creatinine is 1.4, and new onset diabetes with hyperglycemia.

## 2014-07-14 NOTE — ED Notes (Signed)
Report called to [facility].  Pt informed of plan of care.

## 2014-07-14 NOTE — ED Notes (Signed)
[facility] at bedside and preparing for transport.

## 2014-07-14 NOTE — ED Notes (Signed)
Pt c/o urinary frequency x1.5 weeks and today he began feeling dizzy.

## 2014-07-14 NOTE — ED Notes (Signed)
CBG 461- REPORTED TO RN

## 2014-07-14 NOTE — H&P (Signed)
[facility] History and Physical  Patient: Ricky Moore  YCX:[id number]  DOB: 06/08/1976  DOS: the patient was seen and examined on 07/14/2014 PCP: PROVIDER NOT IN SYSTEM  Chief Complaint: complains of dizzy and increasing urinary frequency  HPI: Ricky Moore is a 38 y.o. male with Past medical history of hypertension since last 20 years, GERD, T were, sleep apnea. The patient is presenting with complaints of increased urinary frequency that has been ongoing since last 2 weeks. He also has been having increasing thirst and lost weight. He started having complex of dizziness and severe generalized fatigue today and therefore he decided to come to the hospital. Denies any complaint of fever or chills chest pain shortness of breath cough abdominal pain diarrhea constipation vomiting burning urination or focal deficit. Denies any changes in his medication. Denies taking any herbal supplements. Denies any alcohol or drug abuse. He felt some nausea today. He has not been taking his minoxidil since last few months.  The patient is coming from home. And at his baseline independent for most of his ADL.  Review of Systems: as mentioned in the history of present illness.  A Comprehensive review of the other systems is negative.  Past Medical History  Diagnosis Date  . Complication of anesthesia     SLIGHT NAUSEA  . Hypertension   . GERD (gastroesophageal reflux disease)     RELATED TO CERTAIN FOODS  . Bladder tumor   . Hematuria   . Sleep apnea     USES C PAP   Past Surgical History  Procedure Laterality Date  . Tonsillectomy  2008   Social History:  reports that he has never smoked. He does not have any smokeless tobacco history on file. He reports that he drinks alcohol. He reports that he does not use illicit drugs.  No Known Allergies  Family History  Problem Relation Age of Onset  . Diabetes Mellitus II Maternal Aunt     Prior to Admission medications   Medication Sig  Start Date End Date Taking? Authorizing Provider  amLODipine (NORVASC) 10 MG tablet Take 10 mg by mouth every morning.   Yes Historical Provider, MD  aspirin-acetaminophen-caffeine (EXCEDRIN MIGRAINE) [phone] MG per tablet Take 1 tablet by mouth every 6 (six) hours as needed for headache.   Yes Historical Provider, MD  hydrochlorothiazide (HYDRODIURIL) 25 MG tablet Take 25 mg by mouth every morning.   Yes Historical Provider, MD  ibuprofen (ADVIL,MOTRIN) 200 MG tablet Take 200-400 mg by mouth every 6 (six) hours as needed for mild pain.   Yes Historical Provider, MD  metoprolol succinate (TOPROL-XL) 50 MG 24 hr tablet Take 50 mg by mouth every morning. Take with or immediately following a meal.   Yes Historical Provider, MD  minoxidil (LONITEN) 2.5 MG tablet Take 2.5 mg by mouth 2 (two) times daily.    Historical Provider, MD    Physical Exam: Filed Vitals:   07/14/14 1405 07/14/14 1624 07/14/14 1815 07/14/14 1857  BP: 159/104 159/100 159/93 163/94  Pulse: 84 78 88 89  Temp: 98.1 F (36.7 C) 98.5 F (36.9 C) 98.6 F (37 C) 97.8 F (36.6 C)  TempSrc: Oral Oral Oral Oral  Resp: 18 12 18 19   Height:      Weight:      SpO2: 97% 97% 98% 98%    General: Alert, Awake and Oriented to Time, Place and Person. Appear in mild distress Eyes: PERRL ENT: Oral Mucosa clear moist. Neck: no JVD Cardiovascular: S1 and  S2 Present, no Murmur, Peripheral Pulses Present Respiratory: Bilateral Air entry equal and Decreased, Clear to Auscultation, noCrackles, no wheezes Abdomen: Bowel Sound present, Soft and non tender Skin: no Rash Extremities: no Pedal edema, no calf tenderness Neurologic: Grossly no focal neuro deficit.  Labs on Admission:  CBC:  Recent Labs Lab 07/14/14 1210  WBC 7.6  NEUTROABS 5.3  HGB 16.5  HCT 43.8  MCV 72.2*  PLT 247    CMP     Component Value Date/Time   NA 130* 07/14/2014 1210   K 5.3 07/14/2014 1210   CL 91* 07/14/2014 1210   CO2 24 07/14/2014 1210    GLUCOSE 601* 07/14/2014 1210   BUN 22 07/14/2014 1210   CREATININE 1.40* 07/14/2014 1210   CALCIUM 10.2 07/14/2014 1210   PROT 8.2 07/14/2014 1210   ALBUMIN 4.5 07/14/2014 1210   AST 26 07/14/2014 1210   ALT 46 07/14/2014 1210   ALKPHOS 98 07/14/2014 1210   BILITOT 0.8 07/14/2014 1210   GFRNONAA 62* 07/14/2014 1210   GFRAA 71* 07/14/2014 1210    No results for input(s): LIPASE, AMYLASE in the last 168 hours. No results for input(s): AMMONIA in the last 168 hours.  No results for input(s): CKTOTAL, CKMB, CKMBINDEX, TROPONINI in the last 168 hours. BNP (last 3 results) No results for input(s): PROBNP in the last 8760 hours.  Radiological Exams on Admission: No results found.  Assessment/Plan Principal Problem:   Hyperglycemia Active Problems:   New onset type 2 diabetes mellitus   Mass of bladder   CKD (chronic kidney disease)   Essential hypertension, benign   1. Hyperglycemia The patient is presenting with complains of increasing urinary frequency as well as increased thirst. He is found to have significantly elevated glucose likely secondary to uncontrolled diabetes mellitus. He does not appear to have any anion gap suggesting no DKA. His symptoms have improved significantly with IV fluid bolus. Currently I would continue him on IV hydration. He will be placed on 8 units of insulin Lantus daily at bedtime as well as moderate sliding scale with insulin. Diabetic educator will be consulted. Check hemoglobin A1c.  2.chronic kidney disease. Patient has chronic kidney disease with prior serum creatinine in May 1.36 and today 1.4. Portable etiology of his chronic kidney disease is combination of obstructive uropathy secondary to the bladder mass, hypertensive disease and uncontrolled diabetes mellitus. At present I would gently hydrate him and hold his hydrochlorothiazide. Patient is not taking his minoxidil and he may benefit from adding lisinopril. At present I would  leave dispositions to his PCP. Use IV hydralazine for increased blood pressures.  3.bladder months. Patient is following up with his urologist as an outpatient would continue monitoring.  4.essential hypertension. Continue home medications.  Advance goals of care discussion: full code   Consults: diabetes educator  DVT Prophylaxis: subcutaneous Heparin Nutrition: diabetic diet  Disposition: Admitted to inpatient in telemetry unit.  Author: Berle Mull, MD [facility] Pager: [phone] 07/14/2014, 8:00 PM    If 7PM-7AM, please contact night-coverage [email or web address] Password [email or web address]

## 2014-07-14 NOTE — ED Provider Notes (Signed)
CSN: [id number]     Arrival date & time 07/14/14  1056 History   First MD Initiated Contact with Patient 07/14/14 1204     Chief Complaint  Patient presents with  . Dizziness   (Consider location/radiation/quality/duration/timing/severity/associated sxs/prior Treatment) HPI  Ricky Moore is a 38 yo male with history of hypertension presenting with report of increased thirst and urination x 2 weeks.  He also notes a decline in mental clarity.  He states he has been eating well but has not changed his eating regimen and has noticed an increase in his wt loss also. He notes this am, he was sitting at his desk and and felt hot followed by cold sweats, and nauseated and generally weak. He never felt any pain, just generally felt poorly.  He denies any chest pain, vomiting, diarrhea or fever.  Past Medical History  Diagnosis Date  . Complication of anesthesia     SLIGHT NAUSEA  . Hypertension   . GERD (gastroesophageal reflux disease)     RELATED TO CERTAIN FOODS  . Bladder tumor   . Hematuria   . Sleep apnea     USES C PAP   Past Surgical History  Procedure Laterality Date  . Tonsillectomy  2008   No family history on file. History  Substance Use Topics  . Smoking status: Never Smoker   . Smokeless tobacco: Not on file  . Alcohol Use: Yes     Comment: OCCASIONAL    Review of Systems  Constitutional: Positive for chills. Negative for fever.  HENT: Negative for sore throat.   Eyes: Negative for visual disturbance.  Respiratory: Negative for cough and shortness of breath.   Cardiovascular: Negative for chest pain and leg swelling.  Gastrointestinal: Positive for nausea. Negative for vomiting and diarrhea.  Endocrine: Positive for polydipsia and polyuria.  Genitourinary: Negative for dysuria.  Musculoskeletal: Negative for myalgias.  Skin: Negative for rash.  Neurological: Positive for weakness. Negative for numbness and headaches.    Allergies  Review of patient's  allergies indicates no known allergies.  Home Medications   Prior to Admission medications   Medication Sig Start Date End Date Taking? Authorizing Provider  amLODipine (NORVASC) 10 MG tablet Take 10 mg by mouth every morning.    Historical Provider, MD  aspirin-acetaminophen-caffeine (EXCEDRIN MIGRAINE) [phone] MG per tablet Take 1 tablet by mouth every 6 (six) hours as needed for headache.    Historical Provider, MD  hydrochlorothiazide (HYDRODIURIL) 25 MG tablet Take 25 mg by mouth every morning.    Historical Provider, MD  ibuprofen (ADVIL,MOTRIN) 200 MG tablet Take 200-400 mg by mouth every 6 (six) hours as needed for mild pain.    Historical Provider, MD  metoprolol succinate (TOPROL-XL) 50 MG 24 hr tablet Take 50 mg by mouth every morning. Take with or immediately following a meal.    Historical Provider, MD  minoxidil (LONITEN) 2.5 MG tablet Take 2.5 mg by mouth 2 (two) times daily.    Historical Provider, MD   BP 168/112 mmHg  Pulse 90  Temp(Src) 98 F (36.7 C) (Oral)  Resp 20  Ht 6\' 3"  (1.905 m)  Wt 334 lb (151.501 kg)  BMI 41.75 kg/m2  SpO2 97% Physical Exam  Constitutional: He appears well-developed and well-nourished. No distress.  HENT:  Head: Normocephalic and atraumatic.  Mouth/Throat: Mucous membranes are dry. No oropharyngeal exudate.  Eyes: Conjunctivae are normal.  Neck: Neck supple. No thyromegaly present.  Cardiovascular: Normal rate, regular rhythm and intact distal pulses.  Pulmonary/Chest: Effort normal and breath sounds normal. No respiratory distress. He has no wheezes. He has no rales. He exhibits no tenderness.  Abdominal: Soft. There is no tenderness.  Musculoskeletal: He exhibits no tenderness.  Lymphadenopathy:    He has no cervical adenopathy.  Neurological: He is alert.  Skin: Skin is warm and dry. No rash noted. He is not diaphoretic.  Psychiatric: He has a normal mood and affect.  Nursing note and vitals reviewed.   ED Course   Procedures (including critical care time)  Labs Review Labs Reviewed  URINALYSIS, ROUTINE W REFLEX MICROSCOPIC - Abnormal; Notable for the following:    Specific Gravity, Urine 1.036 (*)    Glucose, UA >1000 (*)    Ketones, ur 15 (*)    All other components within normal limits  COMPREHENSIVE METABOLIC PANEL - Abnormal; Notable for the following:    Sodium 130 (*)    Chloride 91 (*)    Glucose, Bld 601 (*)    Creatinine, Ser 1.40 (*)    GFR calc non Af Amer 62 (*)    GFR calc Af Amer 71 (*)    All other components within normal limits  CBC WITH DIFFERENTIAL - Abnormal; Notable for the following:    RBC 6.07 (*)    MCV 72.2 (*)    MCHC 37.7 (*)    All other components within normal limits  CBG MONITORING, ED - Abnormal; Notable for the following:    Glucose-Capillary 574 (*)    All other components within normal limits  I-STAT VENOUS BLOOD GAS, ED - Abnormal; Notable for the following:    pH, Ven 7.336 (*)    Bicarbonate 25.3 (*)    All other components within normal limits   Imaging Review No results found.   EKG Interpretation None      MDM   Final diagnoses:  Hyperglycemia  Acute renal failure, unspecified acute renal failure type   38 yo male with polyuria, polydipsia x 2 weeks.  Today blood sugar is >500.  Consider newly diagnosed diabetes type 2.  Pt denies any pain or vomiting and generally well-appearing.  Check CBC, CMP, UA, VBG. NS bolus 1L to start and re-eval.  Discussed case with Dr. Johnney Killian.  Labs reviewed: UA shows ketones: 15 and glucose >1000; CMP: significant for glucose: 601, NA: 130, Creatinine: 1.40.  2nd liter of fluid started and 15 liters regular insulin given.  Labs concerning for acute renal failure secondary to hyperglycemia. Pt updated with results of labs and plan to admit and in agreement with plan.   3:55PM: Consulted with hospitalist (Dr. Waldron Labs) for admission.  Pt to be transported from [facility] to [facility] for admission.  The patient  appears reasonably stabilized for admission considering the current resources, flow, and capabilities available in the ED at this time, and I doubt any further screening and/or treatment in the ED prior to admission.    Filed Vitals:   07/14/14 1121 07/14/14 1125 07/14/14 1405  BP: 180/120 168/112 159/104  Pulse: 90  84  Temp: 98 F (36.7 C)  98.1 F (36.7 C)  TempSrc: Oral  Oral  Resp: 20  18  Height: 6\' 3"  (1.905 m)    Weight: 334 lb (151.501 kg)    SpO2: 97%  97%   Meds given in ED:  Medications  sodium chloride 0.9 % bolus 1,000 mL (0 mLs Intravenous Stopped 07/14/14 1326)  ondansetron (ZOFRAN) injection 4 mg (4 mg Intravenous Given 07/14/14 1324)  sodium chloride  0.9 % bolus 1,000 mL (0 mLs Intravenous Stopped 07/14/14 1630)  insulin regular (NOVOLIN R,HUMULIN R) 100 units/mL injection 15 Units (15 Units Subcutaneous Given 07/14/14 1626)    New Prescriptions   No medications on file     Britt Bottom, NP 07/15/14 2044  Charlesetta Shanks, MD 07/19/14 0000

## 2014-07-15 LAB — CBC WITH DIFFERENTIAL/PLATELET
BASOS ABS: 0 10*3/uL (ref 0.0–0.1)
Basophils Relative: 0 % (ref 0–1)
EOS PCT: 3 % (ref 0–5)
Eosinophils Absolute: 0.2 10*3/uL (ref 0.0–0.7)
HCT: 42.4 % (ref 39.0–52.0)
Hemoglobin: 15.7 g/dL (ref 13.0–17.0)
Lymphocytes Relative: 42 % (ref 12–46)
Lymphs Abs: 2.6 10*3/uL (ref 0.7–4.0)
MCH: 27.6 pg (ref 26.0–34.0)
MCHC: 37 g/dL — ABNORMAL HIGH (ref 30.0–36.0)
MCV: 74.6 fL — ABNORMAL LOW (ref 78.0–100.0)
Monocytes Absolute: 0.5 10*3/uL (ref 0.1–1.0)
Monocytes Relative: 8 % (ref 3–12)
NEUTROS PCT: 47 % (ref 43–77)
Neutro Abs: 2.9 10*3/uL (ref 1.7–7.7)
Platelets: 237 10*3/uL (ref 150–400)
RBC: 5.68 MIL/uL (ref 4.22–5.81)
RDW: 13.9 % (ref 11.5–15.5)
WBC: 6.2 10*3/uL (ref 4.0–10.5)

## 2014-07-15 LAB — COMPREHENSIVE METABOLIC PANEL
ALBUMIN: 4 g/dL (ref 3.5–5.2)
ALT: 43 U/L (ref 0–53)
AST: 29 U/L (ref 0–37)
Alkaline Phosphatase: 90 U/L (ref 39–117)
Anion gap: 12 (ref 5–15)
BUN: 17 mg/dL (ref 6–23)
CO2: 26 meq/L (ref 19–32)
CREATININE: 1.24 mg/dL (ref 0.50–1.35)
Calcium: 9.3 mg/dL (ref 8.4–10.5)
Chloride: 99 mEq/L (ref 96–112)
GFR calc Af Amer: 83 mL/min — ABNORMAL LOW (ref >90)
GFR, EST NON AFRICAN AMERICAN: 71 mL/min — AB (ref >90)
Glucose, Bld: 393 mg/dL — ABNORMAL HIGH (ref 70–99)
Potassium: 4.3 mEq/L (ref 3.7–5.3)
SODIUM: 137 meq/L (ref 137–147)
TOTAL PROTEIN: 7.4 g/dL (ref 6.0–8.3)
Total Bilirubin: 1 mg/dL (ref 0.3–1.2)

## 2014-07-15 LAB — GLUCOSE, CAPILLARY
GLUCOSE-CAPILLARY: 321 mg/dL — AB (ref 70–99)
GLUCOSE-CAPILLARY: 288 mg/dL — AB (ref 70–99)
GLUCOSE-CAPILLARY: 278 mg/dL — AB (ref 70–99)
Glucose-Capillary: 306 mg/dL — ABNORMAL HIGH (ref 70–99)

## 2014-07-15 LAB — HEMOGLOBIN A1C
HEMOGLOBIN A1C: 11 % — AB (ref <5.7)
Mean Plasma Glucose: 269 mg/dL — ABNORMAL HIGH (ref <117)

## 2014-07-15 MED ORDER — INFLUENZA VAC SPLIT QUAD 0.5 ML IM SUSY
0.5000 mL | PREFILLED_SYRINGE | INTRAMUSCULAR | Status: AC
  Administered 2014-07-16: 0.5 mL via INTRAMUSCULAR
  Filled 2014-07-15: qty 0.5

## 2014-07-15 MED ORDER — LIVING WELL WITH DIABETES BOOK
Freq: Once | Status: AC
  Administered 2014-07-15: 10:00:00
  Filled 2014-07-15: qty 1

## 2014-07-15 MED ORDER — METFORMIN HCL 500 MG PO TABS
500.0000 mg | ORAL_TABLET | Freq: Two times a day (BID) | ORAL | Status: DC
  Administered 2014-07-15 – 2014-07-16 (×3): 500 mg via ORAL
  Filled 2014-07-15 (×6): qty 1

## 2014-07-15 MED ORDER — LISINOPRIL 10 MG PO TABS
10.0000 mg | ORAL_TABLET | Freq: Every day | ORAL | Status: DC
  Administered 2014-07-15: 10 mg via ORAL
  Filled 2014-07-15: qty 1

## 2014-07-15 MED ORDER — INSULIN GLARGINE 100 UNIT/ML ~~LOC~~ SOLN
25.0000 [IU] | Freq: Every day | SUBCUTANEOUS | Status: DC
  Administered 2014-07-15: 25 [IU] via SUBCUTANEOUS
  Filled 2014-07-15 (×2): qty 0.25

## 2014-07-15 MED ORDER — INSULIN STARTER KIT- PEN NEEDLES (ENGLISH)
1.0000 | Freq: Once | Status: AC
  Administered 2014-07-15: 1
  Filled 2014-07-15: qty 1

## 2014-07-15 MED ORDER — AMLODIPINE BESYLATE 10 MG PO TABS
10.0000 mg | ORAL_TABLET | Freq: Every day | ORAL | Status: DC
  Administered 2014-07-15 – 2014-07-16 (×2): 10 mg via ORAL
  Filled 2014-07-15 (×2): qty 1

## 2014-07-15 NOTE — Progress Notes (Signed)
Utilization review completed.  

## 2014-07-15 NOTE — Plan of Care (Signed)
Problem: Consults Goal: Skin Care Protocol Initiated - if Braden Score 18 or less If consults are not indicated, leave blank or document N/A  Outcome: Not Applicable Date Met:  18/59/09 Goal: Nutrition Consult-if indicated Outcome: Not Applicable Date Met:  31/12/16 Goal: Diabetes Guidelines if Diabetic/Glucose > 140 If diabetic or lab glucose is > 140 mg/dl - Initiate Diabetes/Hyperglycemia Guidelines & Document Interventions  Outcome: Completed/Met Date Met:  07/15/14

## 2014-07-15 NOTE — Progress Notes (Addendum)
Inpatient Diabetes Program Recommendations  AACE/ADA: New Consensus Statement on Inpatient Glycemic Control (2013)  Target Ranges:  Prepandial:   less than 140 mg/dL      Peak postprandial:   less than 180 mg/dL (1-2 hours)      Critically ill patients:  140 - 180 mg/dL   Reason for Assessment:  Results for Ricky Moore, Ricky Moore (MRN [id number]) as of 07/15/2014 09:14  Ref. Range 07/14/2014 14:09 07/14/2014 16:57 07/14/2014 18:17 07/14/2014 20:57 07/15/2014 06:20  Glucose-Capillary Latest Range: 70-99 mg/dL 461 (H) 347 (H) 299 (H) 327 (H) 306 (H)   Results for Ricky Moore, Ricky Moore (MRN [id number]) as of 07/15/2014 09:14  Ref. Range 07/14/2014 20:45  Hgb A1c MFr Bld Latest Range: <5.7 % 11.0 (H)   Diabetes history: New onset Type 2 diabetes Current orders for Inpatient glycemic control:  Novolog moderate tid with meals and HS, Lantus 8 units q HS, Metformin 500 mg bid  Based on patient's weight of 150 kg, consider increasing Lantus to 30 units daily.  Will order insulin starter kit, "Living Well with Diabetes" booklet and diabetes videos.  Will need close follow-up with PCP (unclear of who this is- will discuss with patient).  Will also order outpatient diabetes education per protocol.    Plan to talk with patient this AM.    Thanks, Adah Perl, RN, BC-ADM Inpatient Diabetes Coordinator Pager [phone]   Addendum 11:30a:  Spoke with patient regarding new diagnosis of diabetes.  He states that he has an aunt with diabetes but no other family history that he knows of.  He states that since her diagnosis he has been studying diabetes including diet and lifestyle modifications.  He reports a healthier lifestyle recently and weight loss.  He does still drink sweet tea.  He has no PCP but has done some research and wonders about follow-up with MD close to where he lives.  Will return this afternoon to teach patient proper administration of insulin with pen as this is a more convenient way to administer  insulin.  Discussed Type 2 diabetes, sx. Of high and low CBG's, standards of care, etc. He seems very engaged.  RN to allow patient to self-administer insulin and check CBG.  He is complaining of cough which is new since diagnosis.  Will follow-up this afternoon.

## 2014-07-15 NOTE — Plan of Care (Signed)
Problem: Food- and Nutrition-Related Knowledge Deficit (NB-1.1) Goal: Nutrition education Formal process to instruct or train a patient/client in a skill or to impart knowledge to help patients/clients voluntarily manage or modify food choices and eating behavior to maintain or improve health. Outcome: Completed/Met Date Met:  07/15/14  RD consulted for nutrition education regarding diabetes.     Lab Results  Component Value Date    HGBA1C 11.0* 07/14/2014    RD provided "Carbohydrate Counting for People with Diabetes" handout from the Academy of Nutrition and Dietetics. Discussed different food groups and their effects on blood sugar, emphasizing carbohydrate-containing foods. Provided list of carbohydrates and recommended serving sizes of common foods.  Discussed importance of controlled and consistent carbohydrate intake throughout the day. Emphasized the importance of eating every 4-5 hours to help maintain blood sugar. Provided examples of ways to balance meals/snacks and encouraged intake of high-fiber, whole grain complex carbohydrates. Discussed diabetic friendly drink options.Teach back method used.  Expect good compliance.  Body mass index is 42.03 kg/(m^2). Pt meets criteria for morbid obesity based on current BMI.  Current diet order is heart healthy/carb modifed. Pt reports eating well with no difficulties. Labs and medications reviewed. No further nutrition interventions warranted at this time. RD contact information provided. If additional nutrition issues arise, please re-consult RD.  Kallie Locks, MS, RD, LDN Pager # [phone] After hours/ weekend pager # [phone]

## 2014-07-15 NOTE — Plan of Care (Signed)
Problem: Phase I Progression Outcomes Goal: CBGs steadily decreasing with treatment Outcome: Completed/Met Date Met:  07/15/14 Goal: NPO or per MD order Outcome: Completed/Met Date Met:  07/15/14 Goal: Pain controlled with appropriate interventions Outcome: Completed/Met Date Met:  07/15/14 Goal: OOB as tolerated unless otherwise ordered Outcome: Completed/Met Date Met:  07/15/14 Goal: Initial discharge plan identified Outcome: Completed/Met Date Met:  07/15/14 Goal: Voiding-avoid urinary catheter unless indicated Outcome: Completed/Met Date Met:  07/15/14 Goal: Hemodynamically stable Outcome: Completed/Met Date Met:  07/15/14 Goal: Diabetes Coordinator Consult Outcome: Completed/Met Date Met:  07/15/14 Goal: Other Phase I Outcomes/Goals Outcome: Not Applicable Date Met:  96/75/91

## 2014-07-15 NOTE — Progress Notes (Signed)
Followed up with patient regarding insulin pen teaching.  Demonstrated insulin pen use and allowed patient to return demonstration.  He states "this is easy".  Patient has insulin starter kit at bedside with insulin pen needles.  MD, patient will need Rx. For Lantus solostar pen, insulin pen needles [phone]), and Glucometer with strips/lancets ([phone]) at discharge.  He is also interested in follow- up with PCP and endocrinologist.  RN to show patient diabetes videos.  He has already self administered Lantus today.  Adah Perl, RN, BC-ADM Inpatient Diabetes Coordinator Pager [phone]

## 2014-07-15 NOTE — Progress Notes (Signed)
[facility] PROGRESS NOTE  Ricky Moore STM:[id number] DOB: 12-Oct-1975 DOA: 07/14/2014 PCP: PROVIDER NOT IN SYSTEM  Assessment/Plan: 1. New diagnosis of diabetes mellitus -Patient initially presenting with complaints of polyuria and polydipsia reporting feeling poorly over the past several days -Found to have blood sugars in the 600 range, having hemoglobin A1c of 11 -This morning continues to have elevated blood sugars in the 300 range -Have added metformin 500 mg by mouth twice a day, increased Lantus to 25 units subcutaneous daily. -He was seen and evaluated by diabetic coordinator -continue to monitor blood sugars  2.  Hypertension -Will add lisinopril 10 mg by mouth daily given diagnoses of diabetes, continue metoprolol 50 mg by mouth daily. Discontinue amlodipine for now.  Code Status: full code Family Communication:  Disposition Plan: continue monitoring blood sugars, increasing Lantus, anticipate discharge in the next 24 hours   Consultants:  Diabetic coordinator  HPI/Subjective: Patient is a pleasant 38 year old gentleman who initially presented to [facility] with complaints of polyuria and polydipsia, along with feeling poorly over the past several days. Labs showed a glucose level in the 600 range. He was administered 15 units of IV insulin and transferred to [facility] for further evaluation. He denied having history of diabetes mellitus stating he had "lab work" done about a year ago which came back normal.patient was started on Lantus. Blood sugars this morning remain elevated in the 300 range. Metformin 500 mg by mouth twice a day added with increasing Lantus to 25 units subcutaneous daily. Patient otherwise reporting feeling better.  Objective: Filed Vitals:   07/15/14 0537  BP: 131/88  Pulse: 80  Temp: 98.3 F (36.8 C)  Resp: 18    Intake/Output Summary (Last 24 hours) at 07/15/14 1201 Last data filed at 07/14/14 1759  Gross per 24  hour  Intake   2000 ml  Output      0 ml  Net   2000 ml   Filed Weights   07/14/14 1121 07/15/14 0537  Weight: 151.501 kg (334 lb) 152.545 kg (336 lb 4.8 oz)    Exam:   General:  Patient is in no acute distress awake and alert oriented  Cardiovascular: regular rate and rhythm normal S1-S2  Respiratory: clear to auscultation bilaterally  Abdomen: soft nontender nondistended  Musculoskeletal: no edema   Data Reviewed: Basic Metabolic Panel:  Recent Labs Lab 07/14/14 1210 07/14/14 2045 07/15/14 0856  NA 130* 136* 137  K 5.3 3.5* 4.3  CL 91* 94* 99  CO2 24 26 26   GLUCOSE 601* 321* 393*  BUN 22 19 17   CREATININE 1.40* 1.24 1.24  CALCIUM 10.2 9.7 9.3   Liver Function Tests:  Recent Labs Lab 07/14/14 1210 07/14/14 2045 07/15/14 0856  AST 26 22 29   ALT 46 41 43  ALKPHOS 98 90 90  BILITOT 0.8 0.6 1.0  PROT 8.2 7.5 7.4  ALBUMIN 4.5 4.1 4.0   No results for input(s): LIPASE, AMYLASE in the last 168 hours. No results for input(s): AMMONIA in the last 168 hours. CBC:  Recent Labs Lab 07/14/14 1210 07/15/14 0856  WBC 7.6 6.2  NEUTROABS 5.3 2.9  HGB 16.5 15.7  HCT 43.8 42.4  MCV 72.2* 74.6*  PLT 247 237   Cardiac Enzymes: No results for input(s): CKTOTAL, CKMB, CKMBINDEX, TROPONINI in the last 168 hours. BNP (last 3 results) No results for input(s): PROBNP in the last 8760 hours. CBG:  Recent Labs Lab 07/14/14 1657 07/14/14 1817 07/14/14 2057 07/15/14 8921  07/15/14 1129  GLUCAP 347* 299* 327* 306* 321*    No results found for this or any previous visit (from the past 240 hour(s)).   Studies: No results found.  Scheduled Meds: . amLODipine  10 mg Oral q morning - 10a  . heparin  5,000 Units Subcutaneous 3 times per day  . insulin aspart  0-15 Units Subcutaneous TID WC  . insulin aspart  0-5 Units Subcutaneous QHS  . insulin glargine  25 Units Subcutaneous QHS  . insulin starter kit- pen needles  1 kit Other Once  . living well with  diabetes book   Does not apply Once  . metFORMIN  500 mg Oral BID WC  . metoprolol succinate  50 mg Oral q morning - 10a   Continuous Infusions: . sodium chloride 75 mL/hr (07/14/14 2230)    Principal Problem:   Hyperglycemia Active Problems:   New onset type 2 diabetes mellitus   Mass of bladder   CKD (chronic kidney disease)   Essential hypertension, benign    Time spent:     Kelvin Cellar  [facility] Pager [phone]. If 7PM-7AM, please contact night-coverage at [email or web address], password [facility] 07/15/2014, 12:01 PM  LOS: 1 day

## 2014-07-15 NOTE — Plan of Care (Signed)
Problem: Phase II Progression Outcomes Goal: Tolerating diet Outcome: Completed/Met Date Met:  07/15/14     

## 2014-07-16 LAB — BASIC METABOLIC PANEL
ANION GAP: 14 (ref 5–15)
BUN: 13 mg/dL (ref 6–23)
CHLORIDE: 101 meq/L (ref 96–112)
CO2: 26 mEq/L (ref 19–32)
CREATININE: 1.17 mg/dL (ref 0.50–1.35)
Calcium: 9 mg/dL (ref 8.4–10.5)
GFR calc non Af Amer: 77 mL/min — ABNORMAL LOW (ref >90)
GFR, EST AFRICAN AMERICAN: 89 mL/min — AB (ref >90)
Glucose, Bld: 270 mg/dL — ABNORMAL HIGH (ref 70–99)
POTASSIUM: 3.7 meq/L (ref 3.7–5.3)
SODIUM: 141 meq/L (ref 137–147)

## 2014-07-16 LAB — GLUCOSE, CAPILLARY: GLUCOSE-CAPILLARY: 283 mg/dL — AB (ref 70–99)

## 2014-07-16 LAB — CBC
HEMATOCRIT: 42.2 % (ref 39.0–52.0)
HEMOGLOBIN: 15.3 g/dL (ref 13.0–17.0)
MCH: 27.3 pg (ref 26.0–34.0)
MCHC: 36.3 g/dL — AB (ref 30.0–36.0)
MCV: 75.2 fL — ABNORMAL LOW (ref 78.0–100.0)
Platelets: 256 10*3/uL (ref 150–400)
RBC: 5.61 MIL/uL (ref 4.22–5.81)
RDW: 13.8 % (ref 11.5–15.5)
WBC: 6.1 10*3/uL (ref 4.0–10.5)

## 2014-07-16 MED ORDER — INSULIN GLARGINE 100 UNIT/ML ~~LOC~~ SOLN: 30.0000 [IU] | Freq: Every day | SUBCUTANEOUS | Status: DC

## 2014-07-16 MED ORDER — METFORMIN HCL 500 MG PO TABS
500.0000 mg | ORAL_TABLET | Freq: Once | ORAL | Status: AC
  Administered 2014-07-16: 500 mg via ORAL
  Filled 2014-07-16: qty 1

## 2014-07-16 MED ORDER — METFORMIN HCL 1000 MG PO TABS: 1000.0000 mg | ORAL_TABLET | Freq: Two times a day (BID) | ORAL | Status: DC

## 2014-07-16 NOTE — Plan of Care (Signed)
Problem: Phase II Progression Outcomes Goal: Patient states signs/symptoms high/low CBGs & treatment Outcome: Completed/Met Date Met:  07/16/14 Goal: Patient able to draw up & self administer Insulin Outcome: Completed/Met Date Met:  07/16/14 Goal: Patient able to draw up & self administer Insulin Outcome: Completed/Met Date Met:  07/16/14  Problem: Phase III Progression Outcomes Goal: Patient verbalizes diet requirements Outcome: Completed/Met Date Met:  07/16/14

## 2014-07-16 NOTE — Plan of Care (Signed)
Problem: Discharge Progression Outcomes Goal: Complications resolved/controlled Outcome: Completed/Met Date Met:  07/16/14

## 2014-07-16 NOTE — Discharge Summary (Signed)
Physician Discharge Summary  Ricky Moore WNI:[id number] DOB: 05-29-1976 DOA: 07/14/2014  PCP: PROVIDER NOT IN SYSTEM  Admit date: 07/14/2014 Discharge date: 07/16/2014  Time spent: 35 minutes  Recommendations for Outpatient Follow-up:  1. Please follow-up on blood sugars, patient admitted for newly diagnosed diabetes mellitus, was discharged on metformin and Lantus  Discharge Diagnoses:  Principal Problem:   Hyperglycemia Active Problems:   New onset type 2 diabetes mellitus   Mass of bladder   CKD (chronic kidney disease)   Essential hypertension, benign   Discharge Condition: Stable  Diet recommendation: Carb modified diet  Filed Weights   07/14/14 1121 07/15/14 0537 07/16/14 0500  Weight: 151.501 kg (334 lb) 152.545 kg (336 lb 4.8 oz) 153.86 kg (339 lb 3.2 oz)    History of present illness:  Ricky Moore is a 38 y.o. male with Past medical history of hypertension since last 20 years, GERD, T were, sleep apnea. The patient is presenting with complaints of increased urinary frequency that has been ongoing since last 2 weeks. He also has been having increasing thirst and lost weight. He started having complex of dizziness and severe generalized fatigue today and therefore he decided to come to the hospital. Denies any complaint of fever or chills chest pain shortness of breath cough abdominal pain diarrhea constipation vomiting burning urination or focal deficit. Denies any changes in his medication. Denies taking any herbal supplements. Denies any alcohol or drug abuse. He felt some nausea today. He has not been taking his minoxidil since last few months.  Hospital Course:  Patient is a pleasant 38 year old gentleman who initially presented to [facility] [place] with complaints of polyuria and polydipsia, along with feeling poorly over the past several days. Labs showed a glucose level in the 600 range. He was administered 15 units of IV insulin and transferred to [facility] for further evaluation. He denied having history of diabetes mellitus stating he had "lab work" done about a year ago which came back normal.patient was started on Lantus. On 07/15/2014 blood sugars remained elevated in the 300 range. Metformin 500 mg by mouth twice a day added with increasing Lantus to 25 units subcutaneous daily. Blood sugars subsequently, down into the 200 range. During this hospitalization he was evaluated by diabetic coordinator and received diabetic education including instruction on insulin administration. On discharge his Lantus was increased to 30 units subcutaneous daily along with metformin 1000 mg by mouth 3 times a day. He is instructed to check his blood sugars 3 times daily prior to meals, record these on a log book and present this information to his primary care provider on hospital follow-up.  Consultations:  Diabetes coordinator  Discharge Exam: Filed Vitals:   07/16/14 0654  BP: 142/82  Pulse: 82  Temp: 98.2 F (36.8 C)  Resp: 18    General: patient is awake alert oriented, no acute distress, states feeling better Cardiovascular: regular rate and rhythm normal S1-S2 Respiratory: clear to auscultation bilaterally Abdomen: soft nontender nondistended Extremities: no edema  Discharge Instructions You were cared for by a hospitalist during your hospital stay. If you have any questions about your discharge medications or the care you received while you were in the hospital after you are discharged, you can call the unit and asked to speak with the hospitalist on call if the hospitalist that took care of you is not available. Once you are discharged, your primary care physician will handle any further medical issues. Please note that NO REFILLS  for any discharge medications will be authorized once you are discharged, as it is imperative that you return to your primary care physician (or establish a relationship with a primary care physician if you  do not have one) for your aftercare needs so that they can reassess your need for medications and monitor your lab values.  Discharge Instructions    Call MD for:  difficulty breathing, headache or visual disturbances    Complete by:  As directed      Call MD for:  extreme fatigue    Complete by:  As directed      Call MD for:  hives    Complete by:  As directed      Call MD for:  persistant dizziness or light-headedness    Complete by:  As directed      Call MD for:  persistant nausea and vomiting    Complete by:  As directed      Call MD for:  redness, tenderness, or signs of infection (pain, swelling, redness, odor or green/yellow discharge around incision site)    Complete by:  As directed      Call MD for:  severe uncontrolled pain    Complete by:  As directed      Call MD for:  temperature >100.4    Complete by:  As directed      Diet - low sodium heart healthy    Complete by:  As directed      Discharge instructions    Complete by:  As directed   Please check your blood sugars three times daily before each meal, record them on a log book and provide this information to your doctor on your hospital follow up appointment     Increase activity slowly    Complete by:  As directed           Current Discharge Medication List    START taking these medications   Details  insulin glargine (LANTUS) 100 UNIT/ML injection Inject 0.3 mLs (30 Units total) into the skin at bedtime. Qty: 10 mL, Refills: 11    metFORMIN (GLUCOPHAGE) 1000 MG tablet Take 1 tablet (1,000 mg total) by mouth 2 (two) times daily with a meal. Qty: 60 tablet, Refills: 1      CONTINUE these medications which have NOT CHANGED   Details  amLODipine (NORVASC) 10 MG tablet Take 10 mg by mouth every morning.    aspirin-acetaminophen-caffeine (EXCEDRIN MIGRAINE) 250-250-65 MG per tablet Take 1 tablet by mouth every 6 (six) hours as needed for headache.    hydrochlorothiazide (HYDRODIURIL) 25 MG tablet Take 25 mg  by mouth every morning.    ibuprofen (ADVIL,MOTRIN) 200 MG tablet Take 200-400 mg by mouth every 6 (six) hours as needed for mild pain.    metoprolol succinate (TOPROL-XL) 50 MG 24 hr tablet Take 50 mg by mouth every morning. Take with or immediately following a meal.    minoxidil (LONITEN) 2.5 MG tablet Take 2.5 mg by mouth 2 (two) times daily.       No Known Allergies Follow-up Information    Follow up with PROVIDER NOT IN SYSTEM.       The results of significant diagnostics from this hospitalization (including imaging, microbiology, ancillary and laboratory) are listed below for reference.    Significant Diagnostic Studies: No results found.  Microbiology: No results found for this or any previous visit (from the past 240 hour(s)).   Labs: Basic Metabolic Panel:  Recent Labs Lab  07/14/14 1210 07/14/14 2045 07/15/14 0856 07/16/14 0615  NA 130* 136* 137 141  K 5.3 3.5* 4.3 3.7  CL 91* 94* 99 101  CO2 24 26 26 26   GLUCOSE 601* 321* 393* 270*  BUN 22 19 17 13   CREATININE 1.40* 1.24 1.24 1.17  CALCIUM 10.2 9.7 9.3 9.0   Liver Function Tests:  Recent Labs Lab 07/14/14 1210 07/14/14 2045 07/15/14 0856  AST 26 22 29   ALT 46 41 43  ALKPHOS 98 90 90  BILITOT 0.8 0.6 1.0  PROT 8.2 7.5 7.4  ALBUMIN 4.5 4.1 4.0   No results for input(s): LIPASE, AMYLASE in the last 168 hours. No results for input(s): AMMONIA in the last 168 hours. CBC:  Recent Labs Lab 07/14/14 1210 07/15/14 0856 07/16/14 0615  WBC 7.6 6.2 6.1  NEUTROABS 5.3 2.9  --   HGB 16.5 15.7 15.3  HCT 43.8 42.4 42.2  MCV 72.2* 74.6* 75.2*  PLT 247 237 256   Cardiac Enzymes: No results for input(s): CKTOTAL, CKMB, CKMBINDEX, TROPONINI in the last 168 hours. BNP: BNP (last 3 results) No results for input(s): PROBNP in the last 8760 hours. CBG:  Recent Labs Lab 07/15/14 0620 07/15/14 1129 07/15/14 1616 07/15/14 2141 07/16/14 0642  GLUCAP 306* 321* 288* 278* 283*        Signed:  Kelvin Cellar  [facility] 07/16/2014, 9:33 AM

## 2014-07-16 NOTE — Plan of Care (Signed)
Problem: Phase II Progression Outcomes Goal: Progress activity as tolerated unless otherwise ordered Outcome: Completed/Met Date Met:  07/16/14 Goal: Discharge plan established Outcome: Completed/Met Date Met:  07/16/14 Goal: Other Phase II Outcomes/Goals Outcome: Not Applicable Date Met:  06/98/61

## 2014-07-16 NOTE — Plan of Care (Signed)
Problem: Discharge Progression Outcomes Goal: Barriers To Progression Addressed/Resolved Outcome: Completed/Met Date Met:  07/16/14 Goal: Discharge plan in place and appropriate Outcome: Completed/Met Date Met:  07/16/14 Goal: Pain controlled with appropriate interventions Outcome: Completed/Met Date Met:  07/16/14 Goal: Hemodynamically stable Outcome: Completed/Met Date Met:  07/16/14 Goal: Tolerating diet Outcome: Completed/Met Date Met:  07/16/14 Goal: Activity appropriate for discharge plan Outcome: Completed/Met Date Met:  07/16/14 Goal: Patient states knowledge of home Diabetes Mellitus meds Outcome: Completed/Met Date Met:  07/16/14 Goal: Other Discharge Outcomes/Goals Outcome: Completed/Met Date Met:  07/16/14

## 2014-07-16 NOTE — Plan of Care (Signed)
Problem: Consults Goal: Diagnosis-Diabetes Mellitus Outcome: Completed/Met Date Met:  07/16/14 New Onset Type II

## 2014-07-16 NOTE — Care Management Note (Signed)
CARE MANAGEMENT NOTE 07/16/2014  Patient:  Ricky Moore, Ricky Moore   Account Number:  [id number]  Date Initiated:  07/16/2014  Documentation initiated by:  Oliveras-Aizpurua,Bertha Earwood  Subjective/Objective Assessment:   38 yo male admitted with hyperglycemia.     Action/Plan:   Referral received to set pt up with a PCP. Pt d/c prior to CM meeting with pt. Pt has [employer].   Anticipated DC Date:  07/16/2014   Anticipated DC Plan:  [place]  CM consult      PAC Choice  NA   Choice offered to / List presented to:             Status of service:  Completed, signed off Medicare Important Message given?   (If response is "NO", the following Medicare IM given date fields will be blank) Date Medicare IM given:   Medicare IM given by:   Date Additional Medicare IM given:   Additional Medicare IM given by:    Discharge Disposition:  HOME/SELF CARE  Per UR Regulation:    If discussed at Long Length of Stay Meetings, dates discussed:    Comments:  07/16/14 [place], RN, BSN  Attempted to call pt but phone # has been disconnected or is no longer in service. Called his mom Ricky Moore) at [phone] and left a HIPAA voicemail message asking for pt to return my call.

## 2014-07-16 NOTE — Care Management Note (Signed)
07/16/14 [place], RN, BSN, CM  Pt called me back. Encouraged member to [employer] so they can assist him finding a PCP that is in network. He stated that he will call [employer] on Monday.    07/16/14 1430 Frann Rider, RN, BSN, CM  Mom returned my call. She stated that she will call him and ask him to call me back. He doesn't have another phone # and she doesn't know his fiance's phone #.

## 2014-07-16 NOTE — Plan of Care (Signed)
Problem: Phase III Progression Outcomes Goal: Pain controlled on oral analgesia Outcome: Completed/Met Date Met:  07/16/14 Goal: Activity at appropriate level-compared to baseline (UP IN CHAIR FOR HEMODIALYSIS)  Outcome: Completed/Met Date Met:  07/16/14 Goal: Tolerating diet Outcome: Completed/Met Date Met:  07/16/14 Goal: Discharge plan remains appropriate-arrangements made Outcome: Completed/Met Date Met:  07/16/14 Goal: Other Phase III Outcomes/Goals Outcome: Completed/Met Date Met:  07/16/14

## 2014-07-18 LAB — GLUCOSE, CAPILLARY: Glucose-Capillary: 252 mg/dL — ABNORMAL HIGH (ref 70–99)

## 2014-09-15 ENCOUNTER — Encounter (HOSPITAL_COMMUNITY): Payer: Self-pay | Admitting: Urology

## 2015-05-30 ENCOUNTER — Other Ambulatory Visit: Payer: Self-pay | Admitting: Urology

## 2015-06-02 NOTE — Patient Instructions (Signed)
[place]  06/02/2015   Your procedure is scheduled on:   06-14-2015 Wednesday  Enter through [place] and follow signs to [facility]. Arrive at  1230      PM.  (Limit 1 person with you).  Call this number if you have problems the morning of surgery: [phone]  Or Presurgical Testing [phone].   For Living Will and/or Health Care Power Attorney Forms: please provide copy for your medical record,may bring AM of surgery(Forms should be already notarized -we do not provide this service).(06-06-15 Yes/ No information preferred today).  Remember: Follow any bowel prep instructions per MD office. For Cpap use: Bring mask and tubing only.   Do not eat food/ or drink: After Midnight.  Exception: may have clear liquids:up to 6 Hours before arrival. Nothing after: 0800 AM.  Clear liquids include soda, tea, black coffee, apple or grape juice, broth.  Take these medicines the morning of surgery with A SIP OF WATER-   (DO NOT TAKE ANY DIABETIC MEDS AM OF SURGERY) : A ofmlodipine. Metoprolol. Lantus (1/2 usual PM dose) night before-none AM of.   Do not wear jewelry, make-up or nail polish.  Do not wear deodorant, lotions, powders, or perfumes.   Do not shave legs and under arms- 48 hours(2 days) prior to first CHG shower.(Shaving face and neck okay.)  Do not bring valuables to the hospital.(Hospital is not responsible for lost valuables).  Contacts, dentures or removable bridgework, body piercing, hair pins may not be worn into surgery.  Leave suitcase in the car. After surgery it may be brought to your room.  For patients admitted to the hospital, checkout time is 11:00 AM the day of discharge.(Restricted visitors-Any Persons displaying flu-like symptoms or illness).    Patients discharged the day of surgery will not be allowed to drive home. Must have responsible person with you x 24 hours once discharged.  Name and phone number of your driver:      Please  read over the following fact sheets that you were given:  CHG(Chlorhexidine Gluconate 4% Surgical Soap) use.           Preparing for Surgery Before surgery, you can play an important role.  Because skin is not sterile, your skin needs to be as free of germs as possible.  You can reduce the number of germs on your skin by washing with CHG (chlorahexidine gluconate) soap before surgery.  CHG is an antiseptic cleaner which kills germs and bonds with the skin to continue killing germs even after washing. Please DO NOT use if you have an allergy to CHG or antibacterial soaps.  If your skin becomes reddened/irritated stop using the CHG and inform your nurse when you arrive at Short Stay. Do not shave (including legs and underarms) for at least 48 hours prior to the first CHG shower.  You may shave your face/neck. Please follow these instructions carefully:  1.  Shower with CHG Soap the night before surgery and the  morning of Surgery.  2.  If you choose to wash your hair, wash your hair first as usual with your  normal  shampoo.  3.  After you shampoo, rinse your hair and body thoroughly to remove the  shampoo.                           4.  Use CHG as you would any other liquid soap.  You can  apply chg directly  to the skin and wash                       Gently with a scrungie or clean washcloth.  5.  Apply the CHG Soap to your body ONLY FROM THE NECK DOWN.   Do not use on face/ open                           Wound or open sores. Avoid contact with eyes, ears mouth and genitals (private parts).                       Wash face,  Genitals (private parts) with your normal soap.             6.  Wash thoroughly, paying special attention to the area where your surgery  will be performed.  7.  Thoroughly rinse your body with warm water from the neck down.  8.  DO NOT shower/wash with your normal soap after using and rinsing off  the CHG Soap.                9.  Pat yourself dry with a clean towel.             10.  Wear clean pajamas.            11.  Place clean sheets on your bed the night of your first shower and do not  sleep with pets. Day of Surgery : Do not apply any lotions/deodorants the morning of surgery.  Please wear clean clothes to the hospital/surgery center.  FAILURE TO FOLLOW THESE INSTRUCTIONS MAY RESULT IN THE CANCELLATION OF YOUR SURGERY PATIENT SIGNATURE_________________________________  NURSE SIGNATURE__________________________________  ________________________________________________________________________

## 2015-06-06 ENCOUNTER — Encounter (HOSPITAL_COMMUNITY)
Admission: RE | Admit: 2015-06-06 | Discharge: 2015-06-06 | Disposition: A | Discharge status: Home / Self Care | Payer: Managed Care, Other (non HMO) | Source: Ambulatory Visit | Attending: Family Medicine | Admitting: Family Medicine

## 2015-06-09 NOTE — Patient Instructions (Addendum)
YOUR PROCEDURE IS SCHEDULED ON : 06/14/15  REPORT TO [place] MAIN ENTRANCE FOLLOW SIGNS TO EAST ELEVATOR - GO TO 3rd FLOOR CHECK IN AT 3 EAST NURSES STATION (SHORT STAY) AT:  12:45 PM  CALL THIS NUMBER IF YOU HAVE PROBLEMS THE MORNING OF SURGERY [phone]  REMEMBER:ONLY 1 PER PERSON MAY GO TO SHORT STAY WITH YOU TO GET READY THE MORNING OF YOUR SURGERY  DO NOT EAT FOOD  AFTER MIDNIGHT  MAY HAVE CLEAR LIQUIDS UNTIL 8:45 AM  TAKE THESE MEDICINES THE MORNING OF SURGERY: AMLODIPINE / METOPROLOL  BRING C-PAP TUBING AND MASK TO HOSPITAL  CLEAR LIQUID DIET  Foods Allowed                                                                     Foods Excluded  Coffee and tea, regular and decaf                             liquids that you cannot  Plain Jell-O in any flavor                                             see through such as: Fruit ices (not with fruit pulp)                                     milk, soups, orange juice  Iced Popsicles                                                All solid food Carbonated beverages, regular and diet                                    Cranberry, grape and apple juices Sports drinks like Gatorade Lightly seasoned clear broth or consume(fat free) Sugar, honey syrup  _____________________________________________________________________    YOU MAY NOT HAVE ANY METAL ON YOUR BODY INCLUDING HAIR PINS AND PIERCING'S. DO NOT WEAR JEWELRY, MAKEUP, LOTIONS, POWDERS OR PERFUMES. DO NOT WEAR NAIL POLISH. DO NOT SHAVE 48 HRS PRIOR TO SURGERY. MEN MAY SHAVE FACE AND NECK.  DO NOT [place]. [facility] IS NOT RESPONSIBLE FOR VALUABLES.  CONTACTS, DENTURES OR PARTIALS MAY NOT BE WORN TO SURGERY. LEAVE SUITCASE IN CAR. CAN BE BROUGHT TO ROOM AFTER SURGERY.  PATIENTS DISCHARGED THE DAY OF SURGERY WILL NOT BE ALLOWED TO DRIVE HOME.  PLEASE READ OVER THE FOLLOWING INSTRUCTION  SHEETS _________________________________________________________________________________                                          [place] - PREPARING FOR SURGERY  Before surgery, you can play an important role.  Because skin is not sterile, your skin needs to be as free of germs as possible.  You can reduce the number of germs on your skin by washing with CHG (chlorahexidine gluconate) soap before surgery.  CHG is an antiseptic cleaner which kills germs and bonds with the skin to continue killing germs even after washing. Please DO NOT use if you have an allergy to CHG or antibacterial soaps.  If your skin becomes reddened/irritated stop using the CHG and inform your nurse when you arrive at Short Stay. Do not shave (including legs and underarms) for at least 48 hours prior to the first CHG shower.  You may shave your face. Please follow these instructions carefully:   1.  Shower with CHG Soap the night before surgery and the  morning of Surgery.   2.  If you choose to wash your hair, wash your hair first as usual with your  normal  Shampoo.   3.  After you shampoo, rinse your hair and body thoroughly to remove the  shampoo.                                         4.  Use CHG as you would any other liquid soap.  You can apply chg directly  to the skin and wash . Gently wash with scrungie or clean wascloth    5.  Apply the CHG Soap to your body ONLY FROM THE NECK DOWN.   Do not use on open                           Wound or open sores. Avoid contact with eyes, ears mouth and genitals (private parts).                        Genitals (private parts) with your normal soap.              6.  Wash thoroughly, paying special attention to the area where your surgery  will be performed.   7.  Thoroughly rinse your body with warm water from the neck down.   8.  DO NOT shower/wash with your normal soap after using and rinsing off  the CHG Soap .                9.  Pat yourself dry with a clean  towel.             10.  Wear clean night clothes to bed after shower             11.  Place clean sheets on your bed the night of your first shower and do not  sleep with pets.  Day of Surgery : Do not apply any lotions/deodorants the morning of surgery.  Please wear clean clothes to the hospital/surgery center.  FAILURE TO FOLLOW THESE INSTRUCTIONS MAY RESULT IN THE CANCELLATION OF YOUR SURGERY    PATIENT SIGNATURE_________________________________  ______________________________________________________________________

## 2015-06-12 ENCOUNTER — Encounter (HOSPITAL_COMMUNITY)
Admission: RE | Admit: 2015-06-12 | Discharge: 2015-06-12 | Disposition: A | Discharge status: Home / Self Care | Payer: BLUE CROSS/BLUE SHIELD | Source: Ambulatory Visit | Attending: Urology | Admitting: Urology

## 2015-06-12 ENCOUNTER — Encounter (HOSPITAL_COMMUNITY): Payer: Self-pay

## 2015-06-12 DIAGNOSIS — Z9119 Patient's noncompliance with other medical treatment and regimen: Secondary | ICD-10-CM | POA: Diagnosis not present

## 2015-06-12 DIAGNOSIS — K219 Gastro-esophageal reflux disease without esophagitis: Secondary | ICD-10-CM | POA: Diagnosis not present

## 2015-06-12 DIAGNOSIS — Z79899 Other long term (current) drug therapy: Secondary | ICD-10-CM | POA: Diagnosis not present

## 2015-06-12 DIAGNOSIS — N3021 Other chronic cystitis with hematuria: Secondary | ICD-10-CM | POA: Diagnosis not present

## 2015-06-12 DIAGNOSIS — G4733 Obstructive sleep apnea (adult) (pediatric): Secondary | ICD-10-CM | POA: Diagnosis not present

## 2015-06-12 DIAGNOSIS — R319 Hematuria, unspecified: Secondary | ICD-10-CM | POA: Diagnosis present

## 2015-06-12 DIAGNOSIS — D573 Sickle-cell trait: Secondary | ICD-10-CM | POA: Diagnosis not present

## 2015-06-12 DIAGNOSIS — Z302 Encounter for sterilization: Secondary | ICD-10-CM | POA: Diagnosis not present

## 2015-06-12 DIAGNOSIS — Z791 Long term (current) use of non-steroidal anti-inflammatories (NSAID): Secondary | ICD-10-CM | POA: Diagnosis not present

## 2015-06-12 DIAGNOSIS — Z6841 Body Mass Index (BMI) 40.0 and over, adult: Secondary | ICD-10-CM | POA: Diagnosis not present

## 2015-06-12 DIAGNOSIS — E119 Type 2 diabetes mellitus without complications: Secondary | ICD-10-CM | POA: Diagnosis not present

## 2015-06-12 DIAGNOSIS — Z7984 Long term (current) use of oral hypoglycemic drugs: Secondary | ICD-10-CM | POA: Diagnosis not present

## 2015-06-12 DIAGNOSIS — I10 Essential (primary) hypertension: Secondary | ICD-10-CM | POA: Diagnosis not present

## 2015-06-12 DIAGNOSIS — Z7982 Long term (current) use of aspirin: Secondary | ICD-10-CM | POA: Diagnosis not present

## 2015-06-12 HISTORY — DX: Sickle-cell trait: D57.3

## 2015-06-12 HISTORY — DX: Type 2 diabetes mellitus without complications: E11.9

## 2015-06-12 LAB — BASIC METABOLIC PANEL
Anion gap: 8 (ref 5–15)
BUN: 16 mg/dL (ref 6–20)
CO2: 26 mmol/L (ref 22–32)
Calcium: 9.5 mg/dL (ref 8.9–10.3)
Chloride: 100 mmol/L — ABNORMAL LOW (ref 101–111)
Creatinine, Ser: 1.39 mg/dL — ABNORMAL HIGH (ref 0.61–1.24)
Glucose, Bld: 310 mg/dL — ABNORMAL HIGH (ref 65–99)
Potassium: 3.7 mmol/L (ref 3.5–5.1)
SODIUM: 134 mmol/L — AB (ref 135–145)

## 2015-06-12 LAB — CBC
HCT: 43.5 % (ref 39.0–52.0)
HEMOGLOBIN: 16.1 g/dL (ref 13.0–17.0)
MCH: 27.5 pg (ref 26.0–34.0)
MCHC: 37 g/dL — AB (ref 30.0–36.0)
MCV: 74.2 fL — ABNORMAL LOW (ref 78.0–100.0)
PLATELETS: 295 10*3/uL (ref 150–400)
RBC: 5.86 MIL/uL — ABNORMAL HIGH (ref 4.22–5.81)
RDW: 14.1 % (ref 11.5–15.5)
WBC: 7.6 10*3/uL (ref 4.0–10.5)

## 2015-06-12 NOTE — Anesthesia Preprocedure Evaluation (Addendum)
Anesthesia Evaluation  Patient identified by MRN, date of birth, ID band Patient awake    Reviewed: Allergy & Precautions, H&P , NPO status , Patient's Chart, lab work & pertinent test results, reviewed documented beta blocker date and time   Airway Mallampati: II  TM Distance: >3 FB Neck ROM: full    Dental no notable dental hx. (+) Dental Advisory Given, Teeth Intact   Pulmonary neg pulmonary ROS,    Pulmonary exam normal breath sounds clear to auscultation       Cardiovascular Exercise Tolerance: Good hypertension, Pt. on medications and Pt. on home beta blockers Normal cardiovascular exam Rhythm:regular Rate:Normal  BP 170/114 today. He has taken his blood pressure medications today, but just recently. He last saw his primary care doctor about 6 months ago. States his BP usually runs about 150/100, but his bladder is bothering him today. He was to have this procedure May 2015 but was cancelled at that time for uncontrolled BP. I told him to take all his regular BP medications on the day of surgery and we will try to get him done as procedure somewhat urgent due to pain and bleeding. No symptoms today of toxic hypertension. No headache, visual disturbances nor chest pain.   Neuro/Psych negative neurological ROS  negative psych ROS   GI/Hepatic negative GI ROS, Neg liver ROS,   Endo/Other  diabetes, Poorly Controlled, Type 2, Oral Hypoglycemic Agents, Insulin DependentMorbid obesity  Renal/GU negative Renal ROS  negative genitourinary   Musculoskeletal negative musculoskeletal ROS (+)   Abdominal (+) + obese,   Peds negative pediatric ROS (+)  Hematology negative hematology ROS (+)   Anesthesia Other Findings   Reproductive/Obstetrics negative OB ROS                           Anesthesia Physical Anesthesia Plan  ASA: III  Anesthesia Plan: General   Post-op Pain Management:    Induction:  Intravenous  Airway Management Planned: LMA  Additional Equipment:   Intra-op Plan:   Post-operative Plan:   Informed Consent: I have reviewed the patients History and Physical, chart, labs and discussed the procedure including the risks, benefits and alternatives for the proposed anesthesia with the patient or authorized representative who has indicated his/her understanding and acceptance.   Dental Advisory Given  Plan Discussed with: CRNA and Surgeon  Anesthesia Plan Comments:         Anesthesia Quick Evaluation                                  Anesthesia Evaluation    Airway       Dental   Pulmonary          Cardiovascular hypertension, Pt. on medications and Pt. on home beta blockers     Neuro/Psych    GI/Hepatic   Endo/Other    Renal/GU      Musculoskeletal   Abdominal   Peds  Hematology   Anesthesia Other Findings   Reproductive/Obstetrics                           Anesthesia Physical Anesthesia Plan  ASA: III  Anesthesia Plan: General   Post-op Pain Management:    Induction: Intravenous  Airway Management Planned:   Additional Equipment:   Intra-op Plan:   Post-operative Plan: Extubation in OR  Informed Consent: I  have reviewed the patients History and Physical, chart, labs and discussed the procedure including the risks, benefits and alternatives for the proposed anesthesia with the patient or authorized representative who has indicated his/her understanding and acceptance.   Dental advisory given  Plan Discussed with: CRNA  Anesthesia Plan Comments: (Mr. Canal was in the ER yesterday with very high BP. He took 2 of his four BP medicines today. BP still quite high. Concerning is the fact is that he has no local primary MD. Dr. Tresa Moore has worked him up for pheochromocytoma, but Mr. Petrasek has not seen a primary MD since August in [place]. This procedure is not urgent. Discussed with Dr.  Tresa Moore and patient to find a local MD and at next presentation to also take his minoxidil on the day of surgery. Cancelled for today.)        Anesthesia Quick Evaluation

## 2015-06-12 NOTE — Progress Notes (Signed)
Abnormal BMET faxed to Dr. Manny 

## 2015-06-12 NOTE — Progress Notes (Signed)
Dr.Denenny consulted concerning elevated BP. Dr.Denenny came to PST and spoke with pt. Said pt did not need further care for BP today and instructed pt to take all BP meds the day of surgery .

## 2015-06-13 MED ORDER — DEXTROSE 5 % IV SOLN
3.0000 g | INTRAVENOUS | Status: AC
  Administered 2015-06-14: 3 g via INTRAVENOUS
  Filled 2015-06-13: qty 3000

## 2015-06-14 ENCOUNTER — Ambulatory Visit (HOSPITAL_COMMUNITY): Payer: BLUE CROSS/BLUE SHIELD | Admitting: Anesthesiology

## 2015-06-14 ENCOUNTER — Ambulatory Visit (HOSPITAL_COMMUNITY)
Admission: RE | Admit: 2015-06-14 | Discharge: 2015-06-14 | Disposition: A | Discharge status: Home / Self Care | Payer: BLUE CROSS/BLUE SHIELD | Source: Ambulatory Visit | Attending: Urology | Admitting: Urology

## 2015-06-14 ENCOUNTER — Encounter (HOSPITAL_COMMUNITY)
Admission: RE | Disposition: A | Discharge status: Home / Self Care | Payer: Self-pay | Source: Ambulatory Visit | Attending: Urology

## 2015-06-14 ENCOUNTER — Encounter (HOSPITAL_COMMUNITY): Payer: Self-pay | Admitting: *Deleted

## 2015-06-14 DIAGNOSIS — Z791 Long term (current) use of non-steroidal anti-inflammatories (NSAID): Secondary | ICD-10-CM | POA: Insufficient documentation

## 2015-06-14 DIAGNOSIS — D573 Sickle-cell trait: Secondary | ICD-10-CM | POA: Insufficient documentation

## 2015-06-14 DIAGNOSIS — N3021 Other chronic cystitis with hematuria: Secondary | ICD-10-CM | POA: Insufficient documentation

## 2015-06-14 DIAGNOSIS — E119 Type 2 diabetes mellitus without complications: Secondary | ICD-10-CM | POA: Insufficient documentation

## 2015-06-14 DIAGNOSIS — G4733 Obstructive sleep apnea (adult) (pediatric): Secondary | ICD-10-CM | POA: Insufficient documentation

## 2015-06-14 DIAGNOSIS — I10 Essential (primary) hypertension: Secondary | ICD-10-CM | POA: Insufficient documentation

## 2015-06-14 DIAGNOSIS — Z302 Encounter for sterilization: Secondary | ICD-10-CM | POA: Insufficient documentation

## 2015-06-14 DIAGNOSIS — Z79899 Other long term (current) drug therapy: Secondary | ICD-10-CM | POA: Insufficient documentation

## 2015-06-14 DIAGNOSIS — Z6841 Body Mass Index (BMI) 40.0 and over, adult: Secondary | ICD-10-CM | POA: Insufficient documentation

## 2015-06-14 DIAGNOSIS — Z7982 Long term (current) use of aspirin: Secondary | ICD-10-CM | POA: Insufficient documentation

## 2015-06-14 DIAGNOSIS — K219 Gastro-esophageal reflux disease without esophagitis: Secondary | ICD-10-CM | POA: Insufficient documentation

## 2015-06-14 DIAGNOSIS — Z9119 Patient's noncompliance with other medical treatment and regimen: Secondary | ICD-10-CM | POA: Insufficient documentation

## 2015-06-14 HISTORY — PX: CYSTOSCOPY WITH RETROGRADE PYELOGRAM, URETEROSCOPY AND STENT PLACEMENT: SHX5789

## 2015-06-14 HISTORY — PX: VASECTOMY: SHX75

## 2015-06-14 LAB — GLUCOSE, CAPILLARY
GLUCOSE-CAPILLARY: 340 mg/dL — AB (ref 65–99)
Glucose-Capillary: 283 mg/dL — ABNORMAL HIGH (ref 65–99)
Glucose-Capillary: 318 mg/dL — ABNORMAL HIGH (ref 65–99)

## 2015-06-14 SURGERY — CYSTOURETEROSCOPY, WITH RETROGRADE PYELOGRAM AND STENT INSERTION
Anesthesia: General | Laterality: Bilateral

## 2015-06-14 MED ORDER — ONDANSETRON HCL 4 MG/2ML IJ SOLN
INTRAMUSCULAR | Status: DC | PRN
  Administered 2015-06-14: 4 mg via INTRAVENOUS

## 2015-06-14 MED ORDER — MIDAZOLAM HCL 2 MG/2ML IJ SOLN
INTRAMUSCULAR | Status: AC
  Filled 2015-06-14: qty 4

## 2015-06-14 MED ORDER — INSULIN ASPART 100 UNIT/ML ~~LOC~~ SOLN: 0.0000 [IU] | Freq: Three times a day (TID) | SUBCUTANEOUS | Status: DC

## 2015-06-14 MED ORDER — MIDAZOLAM HCL 5 MG/5ML IJ SOLN
INTRAMUSCULAR | Status: DC | PRN
  Administered 2015-06-14: 2 mg via INTRAVENOUS

## 2015-06-14 MED ORDER — TRAMADOL HCL 50 MG PO TABS: 50.0000 mg | ORAL_TABLET | Freq: Four times a day (QID) | ORAL | Status: DC | PRN

## 2015-06-14 MED ORDER — FENTANYL CITRATE (PF) 100 MCG/2ML IJ SOLN: 25.0000 ug | INTRAMUSCULAR | Status: DC | PRN

## 2015-06-14 MED ORDER — 0.9 % SODIUM CHLORIDE (POUR BTL) OPTIME
TOPICAL | Status: DC | PRN
  Administered 2015-06-14: 1000 mL

## 2015-06-14 MED ORDER — INSULIN ASPART 100 UNIT/ML ~~LOC~~ SOLN
11.0000 [IU] | Freq: Once | SUBCUTANEOUS | Status: AC
  Administered 2015-06-14: 11 [IU] via SUBCUTANEOUS

## 2015-06-14 MED ORDER — ONDANSETRON HCL 4 MG/2ML IJ SOLN
INTRAMUSCULAR | Status: AC
  Filled 2015-06-14: qty 2

## 2015-06-14 MED ORDER — FENTANYL CITRATE (PF) 100 MCG/2ML IJ SOLN
INTRAMUSCULAR | Status: AC
  Filled 2015-06-14: qty 4

## 2015-06-14 MED ORDER — PROPOFOL 10 MG/ML IV BOLUS
INTRAVENOUS | Status: AC
  Filled 2015-06-14: qty 20

## 2015-06-14 MED ORDER — LACTATED RINGERS IV SOLN
INTRAVENOUS | Status: DC
  Administered 2015-06-14: 1000 mL via INTRAVENOUS
  Administered 2015-06-14: 14:00:00 via INTRAVENOUS

## 2015-06-14 MED ORDER — CLINDAMYCIN HCL 300 MG PO CAPS: 600.0000 mg | ORAL_CAPSULE | Freq: Three times a day (TID) | ORAL | Status: DC

## 2015-06-14 MED ORDER — LACTATED RINGERS IV SOLN: INTRAVENOUS | Status: DC

## 2015-06-14 MED ORDER — FENTANYL CITRATE (PF) 100 MCG/2ML IJ SOLN
INTRAMUSCULAR | Status: DC | PRN
  Administered 2015-06-14: 50 ug via INTRAVENOUS
  Administered 2015-06-14: 25 ug via INTRAVENOUS
  Administered 2015-06-14: 50 ug via INTRAVENOUS
  Administered 2015-06-14 (×3): 25 ug via INTRAVENOUS

## 2015-06-14 MED ORDER — STERILE WATER FOR IRRIGATION IR SOLN
Status: DC | PRN
  Administered 2015-06-14: 3000 mL

## 2015-06-14 MED ORDER — LIDOCAINE HCL (CARDIAC) 20 MG/ML IV SOLN
INTRAVENOUS | Status: DC | PRN
  Administered 2015-06-14: 30 mg via INTRAVENOUS

## 2015-06-14 MED ORDER — SENNOSIDES-DOCUSATE SODIUM 8.6-50 MG PO TABS: 1.0000 | ORAL_TABLET | Freq: Two times a day (BID) | ORAL | Status: DC

## 2015-06-14 MED ORDER — INSULIN ASPART 100 UNIT/ML ~~LOC~~ SOLN
SUBCUTANEOUS | Status: AC
  Filled 2015-06-14: qty 1

## 2015-06-14 MED ORDER — PROPOFOL 10 MG/ML IV BOLUS
INTRAVENOUS | Status: DC | PRN
  Administered 2015-06-14: 200 mg via INTRAVENOUS

## 2015-06-14 SURGICAL SUPPLY — 25 items
BASKET LASER NITINOL 1.9FR (BASKET) IMPLANT
BASKET STNLS GEMINI 4WIRE 3FR (BASKET) IMPLANT
BASKET ZERO TIP NITINOL 2.4FR (BASKET) IMPLANT
BNDG GAUZE ELAST 4 BULKY (GAUZE/BANDAGES/DRESSINGS) ×2 IMPLANT
CATH INTERMIT  6FR 70CM (CATHETERS) ×2 IMPLANT
CLOTH BEACON ORANGE TIMEOUT ST (SAFETY) ×2 IMPLANT
ELECT PENCIL ROCKER SW 15FT (MISCELLANEOUS) ×2 IMPLANT
ELECT REM PT RETURN 9FT ADLT (ELECTROSURGICAL)
ELECTRODE REM PT RTRN 9FT ADLT (ELECTROSURGICAL) IMPLANT
FIBER LASER FLEXIVA 1000 (UROLOGICAL SUPPLIES) IMPLANT
FIBER LASER FLEXIVA 200 (UROLOGICAL SUPPLIES) IMPLANT
FIBER LASER FLEXIVA 365 (UROLOGICAL SUPPLIES) IMPLANT
FIBER LASER FLEXIVA 550 (UROLOGICAL SUPPLIES) IMPLANT
FIBER LASER TRAC TIP (UROLOGICAL SUPPLIES) IMPLANT
GLOVE BIOGEL M STRL SZ7.5 (GLOVE) ×2 IMPLANT
GOWN STRL REUS W/TWL XL LVL3 (GOWN DISPOSABLE) ×4 IMPLANT
GUIDEWIRE ANG ZIPWIRE 038X150 (WIRE) IMPLANT
GUIDEWIRE STR DUAL SENSOR (WIRE) ×2 IMPLANT
IV NS IRRIG 3000ML ARTHROMATIC (IV SOLUTION) IMPLANT
PACK CYSTO (CUSTOM PROCEDURE TRAY) ×2 IMPLANT
SUPPORT SCROTAL LG STRP (MISCELLANEOUS) ×2 IMPLANT
SUT CHROMIC 3 0 SH 27 (SUTURE) ×4 IMPLANT
SYRINGE 10CC LL (SYRINGE) IMPLANT
SYRINGE IRR TOOMEY STRL 70CC (SYRINGE) IMPLANT
TUBE FEEDING 8FR 16IN STR KANG (MISCELLANEOUS) IMPLANT

## 2015-06-14 NOTE — Progress Notes (Signed)
Dr Smith Robert notified of patient's CBG 283. No new orders

## 2015-06-14 NOTE — H&P (Signed)
Ricky Moore is an 39 y.o. male.    Chief Complaint: Pre-op Cysto with Bladder Biopsy and Vasectomy  HPI:         1 - Gross Hematuria / "Benign Bladder Lesions" - Pt with h/o "benign bladder lesions" s/p biopsy / resection 2013 in [place] found on w/u gross hematuria. Non smoker. No dye / chemical exposure.  CT Urogram 12/2013 with possible posterior bladder wall thickening, cysto with unusual nodular posterior bladder tissue. He was scheduled for operative biopsy 05/2014 but surgery cancelled due to non-compliant severe hypertension.  2 - Severe Hypertension - 67md or more HTN for many years with DBP >100 on meds per report. CT 12/2013 w/o adrenal mass or obvious renal artery lesions. Metanephrines, Cortisol normal 2015.   3 - Pelvic Pain / Pressure - Pt with years pelvic pressure, has correlated with hematuria episodes. DRE 12/2013 30gm, PVR 12/2013 "0 mL"  4 - Desire for male sterilization - PT with 5 healthy children age 5137moand older. He and his wife have been considering vasctomy for at least 65m15moNO h/o easy bleeding. Vas palpable bilaterally on exam.   PMH sig for  obesity, severe HTN, DM2, OSA, medicla non-compliance. [place] to proceed with cysto / bladder biopsy and vasectomy. Most recent UA without infectious parameters.   Past Medical History  Diagnosis Date  . Complication of anesthesia     SLIGHT NAUSEA  . Hypertension   . GERD (gastroesophageal reflux disease)     RELATED TO CERTAIN FOODS  . Bladder tumor   . Hematuria   . Sleep apnea     USES C PAP  . Sickle cell trait ([facility] . [facility]   Past Surgical History  Procedure Laterality Date  . Tonsillectomy  2008  . Transurethral resection of bladder tumor with gyrus (turbt-gyrus)  2010    Family History  Problem Relation Age of Onset  . Diabetes Mellitus II Maternal Aunt     Social History:  reports that he has never smoked. He does not have any smokeless tobacco history on file. He reports that he drinks alcohol. He reports that he does not use illicit drugs.  Allergies: No Known Allergies  No prescriptions prior to admission    Results for orders placed or performed during the hospital encounter of 06/12/15 (from the past 48 hour(s))  CBC     Status: Abnormal   Collection Time: 06/12/15  9:10 AM  Result Value Ref Range   WBC 7.6 4.0 - 10.5 K/uL   RBC 5.86 (H) 4.22 - 5.81 MIL/uL   Hemoglobin 16.1 13.0 - 17.0 g/dL   HCT 43.5 39.0 - 52.0 %   MCV 74.2 (L) 78.0 - 100.0 fL   MCH 27.5 26.0 - 34.0 pg   MCHC 37.0 (H) 30.0 - 36.0 g/dL   RDW 14.1 11.5 - 15.5 %   Platelets 295 150 - 400 K/uL  Basic metabolic panel     Status: Abnormal   Collection Time: 06/12/15  9:10 AM  Result Value Ref Range   Sodium 134 (L) 135 - 145 mmol/L   Potassium 3.7 3.5 - 5.1 mmol/L   Chloride 100 (L) 101 - 111 mmol/L   CO2 26 22 - 32 mmol/L   Glucose, Bld 310 (H) 65 - 99 mg/dL   BUN 16 6 - 20 mg/dL  Creatinine, Ser 1.39 (H) 0.61 - 1.24 mg/dL   Calcium 9.5 8.9 - 10.3 mg/dL   GFR calc non Af Amer >60 >60 mL/min   GFR calc Af Amer >60 >60 mL/min    Comment: (NOTE) The eGFR has been calculated using the CKD EPI equation. This calculation has not been validated in all clinical situations. eGFR's persistently <60 mL/min signify possible Chronic Kidney Disease.    Anion gap 8 5 - 15   No results found.  Review of Systems  Constitutional: Negative.  Negative for fever and chills.  HENT: Negative.   Eyes: Negative.   Respiratory: Negative.   Cardiovascular: Negative.   Gastrointestinal: Negative.   Genitourinary: Negative.   Musculoskeletal: Negative.   Skin: Negative.   Neurological: Negative.   Endo/Heme/Allergies: Negative.   Psychiatric/Behavioral: Negative.     There were no vitals taken for this visit. Physical Exam  Constitutional: He appears  well-developed.  HENT:  Head: Normocephalic.  Eyes: Pupils are equal, round, and reactive to light.  Neck: Normal range of motion.  Cardiovascular: Normal rate.   Respiratory: Effort normal.  GI: Soft.  Genitourinary:  No CVAT  Musculoskeletal: Normal range of motion.  Neurological: He is alert.  Skin: Skin is warm.  Psychiatric: He has a normal mood and affect. His behavior is normal. Judgment and thought content normal.     Assessment/Plan  1 - Gross Hematuria / "Benign Bladder Lesions" - Eval with imaging, exam, labs, CT, cysto with unusual nodular tissue at intertrigone area. Given hematuria and sympotms I still feel TURBT warranted to at a minimum rule out malignancy.   We discussed operative biopsy / transurethral resection as the best next step for diagnostic and therapeutic purposes with goals being to remove all visible cancer and obtain tissue for pathologic exam. We discussed that for some low-grade tumors, this may be all the treatment required, but that for many other tumors such as high-grade lesions, further therapy including surgery and or chemotherapy may be warranted. We also outlined the fact that any bladder cancer diagnosis will require close follow-up with periodic upper and lower tract evaluation. We discussed risks including bleeding, infection, damage to kidney / ureter / bladder including bladder perforation which can typically managed with prolonged foley catheterization. We mentioned anesthetic and other rare risks including DVT, PE, MI, and mortality. I also mentioned that adjunctive procedures such as ureteral stenting, retrograde pyelography, and ureteroscopy may be necessary to fully evaluate the urinary tract depending on intra-operative findings. After answering all questions to the patient's satisfaction, they wish to proceed.    2 - Severe Hypertension - CT without obvious renal vascular or adrenal lesions. Metanephrines, Cortisol normal. Suspect severe  primary HTN. He is now on medicla therapy and compliant.   3 - Pelvic Pain / Pressure - Suspect bladder lesion contributory, TURBT as per above.   4 - Desire for male sterilization - will do at cysto procedure above.   We discussed the risks (pain, hematoma) and benefits (permanent sterility) of vasectomy. I reinforced that although vas reversals are possible, he should consider the procedure permanent. I outlined the procedure and peri-procedural course with need to bring a driver and have limited activity x2-3 days afterwards.  I also reinforced that he should not engage in intercourse without other contraceptives until specifically cleared by me after two negative semenanalysis. I did outline alternative methods of contraception (condom, OCP, injections, tubal ligation).   Jaleiyah Alas 06/14/2015, 6:36 AM

## 2015-06-14 NOTE — Transfer of Care (Signed)
Immediate Anesthesia Transfer of Care Note  Patient: Ricky Moore  Procedure(s) Performed: Procedure(s): CYSTOSCOPY WITH BIOPSY, FULGERATION,  BILATERAL RETROGRADE PYELOGRAM (Bilateral) VASECTOMY (Bilateral)  Patient Location: PACU  Anesthesia Type:General  Level of Consciousness: awake and patient cooperative  Airway & Oxygen Therapy: Patient Spontanous Breathing and Patient connected to face mask oxygen  Post-op Assessment: Report given to RN and Post -op Vital signs reviewed and stable  Post vital signs: Reviewed and stable  Last Vitals:  Filed Vitals:   06/14/15 1236  BP: 146/93  Pulse: 82  Temp: 36.8 C  Resp: 18    Complications: No apparent anesthesia complications

## 2015-06-14 NOTE — Anesthesia Postprocedure Evaluation (Signed)
  Anesthesia Post-op Note  Patient: Ricky Moore  Procedure(s) Performed: Procedure(s) (LRB): CYSTOSCOPY WITH BIOPSY, FULGERATION,  BILATERAL RETROGRADE PYELOGRAM (Bilateral) VASECTOMY (Bilateral)  Patient Location: PACU  Anesthesia Type: General  Level of Consciousness: awake and alert   Airway and Oxygen Therapy: Patient Spontanous Breathing  Post-op Pain: mild  Post-op Assessment: Post-op Vital signs reviewed, Patient's Cardiovascular Status Stable, Respiratory Function Stable, Patent Airway and No signs of Nausea or vomiting  Last Vitals:  Filed Vitals:   06/14/15 1652  BP: 159/101  Pulse: 71  Temp: 36.6 C  Resp: 12    Post-op Vital Signs: stable   Complications: No apparent anesthesia complications

## 2015-06-14 NOTE — Brief Op Note (Signed)
06/14/2015  3:27 PM  PATIENT:  Ricky Moore  39 y.o. male  PRE-OPERATIVE DIAGNOSIS:  HEMATURIA, BLADDER  LESION, DESIRE FOR VASECTOMY  POST-OPERATIVE DIAGNOSIS:  HEMATURIA, BLADDER  LESION, DESIRE FOR VASECTOMY  PROCEDURE:  Procedure(s): CYSTOSCOPY WITH BIOPSY, FULGERATION,  BILATERAL RETROGRADE PYELOGRAM (Bilateral) VASECTOMY (Bilateral)  SURGEON:  Surgeon(s) and Role:    * Alexis Frock, MD - Primary  PHYSICIAN ASSISTANT:   ASSISTANTS: none   ANESTHESIA:   general  EBL:     BLOOD ADMINISTERED:none  DRAINS: none   LOCAL MEDICATIONS USED:  NONE  SPECIMEN:  Source of Specimen:  1 -  bladder erythema, 2 - right vas deferns, 3 - left vas deferens  DISPOSITION OF SPECIMEN:  PATHOLOGY  COUNTS:  YES  TOURNIQUET:  * No tourniquets in log *  DICTATION: .Other Dictation: Dictation Number [id number]  PLAN OF CARE: Discharge to home after PACU  PATIENT DISPOSITION:  PACU - hemodynamically stable.   Delay start of Pharmacological VTE agent (>24hrs) due to surgical blood loss or risk of bleeding: not applicable

## 2015-06-14 NOTE — Anesthesia Procedure Notes (Signed)
Procedure Name: LMA Insertion Date/Time: 06/14/2015 2:30 PM Performed by: Lajuana Carry E Pre-anesthesia Checklist: Patient identified, Emergency Drugs available, Suction available and Patient being monitored Patient Re-evaluated:Patient Re-evaluated prior to inductionOxygen Delivery Method: Circle system utilized Preoxygenation: Pre-oxygenation with 100% oxygen Intubation Type: IV induction Ventilation: Mask ventilation without difficulty LMA: LMA inserted LMA Size: 5.0 Number of attempts: 1 Placement Confirmation: positive ETCO2 and CO2 detector Dental Injury: Teeth and Oropharynx as per pre-operative assessment

## 2015-06-14 NOTE — Discharge Instructions (Signed)
1 - You may have urinary urgency (bladder spasms) and bloody urine on / off x few days, this is normal.  2 - You may have some scrotal swelling and mild oozing from skin incision sites x few days, this is also normal.   3 - Call MD or go to ER for fever >102, severe pain / nausea / vomiting not relieved by medications, or acute change in medical status

## 2015-06-15 NOTE — Op Note (Signed)
[name], [name]  [id number]  MEDICAL RECORD NO.:  [id number]  LOCATION:  [facility]                         FACILITY:  [facility]  PHYSICIAN:  Alexis Frock, MD     DATE OF BIRTH:  08-Dec-1975  DATE OF PROCEDURE: 06/14/2015                              OPERATIVE REPORT  DIAGNOSES: 1. Hematuria, history of benign bladder lesions. 2. Desire for male sterility.  PROCEDURES: 1. Cystoscopy with bilateral retrograde pyelograms with     interpretation. 2. Bladder biopsy of fulguration. 3. Bilateral vasectomy.  ESTIMATED BLOOD LOSS:  Nil.  COMPLICATIONS:  None.  SPECIMEN: 1. Bladder erythema, permanent pathology. 2. Right and left vas deferens for permanent pathology.  FINDINGS: 1. Inter-trigone and posterior wall bladder erythema and bullous edema     favor inflammatory in nature, represent a specimen set aside for     permanent pathology. 2. Unremarkable retrograde pyelograms. 3. Excellent hemostasis following bladder biopsy and fulguration. 4. Very small caliber and somewhat atretic right vas deferens, this     necessitated delivery of the right testicle to verify segmental     removal of vas.  INDICATION:  Ricky Moore is a pleasant 39 year old gentleman with history of quite significant hypertension as well as elevated blood glucose and history of on and off again hematuria.  He underwent evaluation for this previously several years ago and had bladder biopsy, which he told was benign and subsequently developed recurrent gross hematuria, underwent evaluation for this several months ago and imaging was unremarkable, but office cystoscopy corroborated some friable erythematous areas in the posterior wall of the bladder, there was somewhat concerning for carcinoma.  At that time, he was counseled towards repeat bladder biopsy; however, he was lost to follow up.  He presented with desire to proceed with bladder biopsy.  Nearly a year after prior  recommendation, he also expressed desire for vasectomy.  He has 5 children, all that are healthy and desire this concomitantly.  An informed consent was obtained and placed in the medical record.  PROCEDURE IN DETAIL:  The patient being Ricky Moore, was verified. Procedure being cysto and bilateral retrograde bladder biopsy and dissecting was confirmed.  Procedure was carried out.  Time-out was performed.  Intravenous antibiotics were administered.  General LMA anesthesia introduced.  The patient was into a low lithotomy position, and sterile field was created by prepping and draping the patient's penis, perineum, and proximal thighs using iodine x3.  Next, cystourethroscopy was performed using a 23-French rigid cystoscope with 30-degree offset lens.  Inspection of the anterior and posterior urethra were unremarkable.  Inspection of the urinary bladder revealed no diverticula, calcifications, or papular lesions.  There was some bullous erythema that was quite friable in the posterior and small inter-trigone area with some bleeding.  We just touching this area with the cystoscope, this appeared to be more inflammatory likely in nature. Ureteral orifice was in normal anatomic position.  Attention was directed to retrograde pyelograms and there have been greater than 1 year since his previous axial imaging.  The right ureteral orifice was cannulated with 6-French end-hold catheter and right retrograde pyelogram was obtained.  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  No filling defects or narrowing noted. Similarly, the left retrograde pyelogram was obtained.  Left retrograde pyelogram demonstrated a single left ureter with single system left kidney.  No filling defects or narrowing noted.  The area of bullous erythema was then biopsied using cold cup flexible biopsy forceps under direct cystoscopic vision x3.  These small  tissue fragments were set aside for permanent pathology, labeled as such. Bugbee electrode was used to apply desiccation current to the sites, resulted in excellent hemostasis and no evidence of bladder perforation. Bladder was emptied per cystoscope.    Attention was then directed at bilateral vasectomy.  The left vas deferens was easily palpable and skin puncture may directly onto this using needle tip hemostat.  The vas was grasped with ring grasper and delivered a segment approximately an inch in length, was isolated, doubly clamped, ligated and set aside.  It appeared to have a lumen within it and set aside for permanent pathology, labeled the left vas deferens.  Each end was tied with a chromic stitch and the mucosal edge was fulgurated, it was delivered back into the left hemiscrotum and a single 3-0 chromic stitch was applied at the level of the skin.  The right testicle was then palpated and significant difficulty isolating the right vas deferens.  The cord was easily palpable.  The epididymis was palpable, but the right vas deferens was not easily appreciated.  Multiple attempts were made to isolate the structure through the skin using variety of techniques including warm gel on the skin.  At this point as the patient was under general anesthesia, decision was made to deliver the right testicle.  As such, a curvilinear incision was made approximately 1 inch in length and the right testicle was delivered and tunics dissected down to the testicle itself.  Epididymis was then traced proximally to a very small- caliber vas deferens.  There was also some fat tracking along this consistent with possible small hernia that likely could explain the difficulty palpating through the skin.  A 1-inch segment was isolated, doubly clipped, ligated, set aside and labeled as left vas deferens. Again, this appeared to have a luminal with interrogation with a needle- nose hemostat.  Each edge  was ligated with chromic suture.  Mucosal edges fulgurated.  The right testicle was then redelivered to the right hemiscrotum and the incision site was closed using U-stitch chromic level of skin x2.  Scrotal support was applied.  Procedure was then terminated.  The patient tolerated the procedure well.  There were no immediate periprocedural complications.  The patient was taken to the postanesthesia care unit in stable condition.          ______________________________ Alexis Frock, MD     TM/MEDQ  D:  06/14/2015  T:  06/15/2015  Job:  [id number]

## 2016-03-08 ENCOUNTER — Encounter: Payer: Self-pay | Admitting: Internal Medicine

## 2016-03-08 ENCOUNTER — Ambulatory Visit (INDEPENDENT_AMBULATORY_CARE_PROVIDER_SITE_OTHER): Payer: BLUE CROSS/BLUE SHIELD | Admitting: Internal Medicine

## 2016-03-08 ENCOUNTER — Ambulatory Visit (INDEPENDENT_AMBULATORY_CARE_PROVIDER_SITE_OTHER)
Admission: RE | Admit: 2016-03-08 | Discharge: 2016-03-08 | Disposition: A | Discharge status: Home / Self Care | Payer: BLUE CROSS/BLUE SHIELD | Source: Ambulatory Visit | Attending: Internal Medicine | Admitting: Internal Medicine

## 2016-03-08 ENCOUNTER — Other Ambulatory Visit (INDEPENDENT_AMBULATORY_CARE_PROVIDER_SITE_OTHER): Payer: BLUE CROSS/BLUE SHIELD

## 2016-03-08 VITALS — BP 130/80 | HR 105 | Ht 75.0 in | Wt 333.6 lb

## 2016-03-08 DIAGNOSIS — R05 Cough: Secondary | ICD-10-CM | POA: Diagnosis not present

## 2016-03-08 DIAGNOSIS — R058 Other specified cough: Secondary | ICD-10-CM | POA: Insufficient documentation

## 2016-03-08 LAB — CBC WITH DIFFERENTIAL/PLATELET
BASOS ABS: 0 10*3/uL (ref 0.0–0.1)
BASOS PCT: 0.3 % (ref 0.0–3.0)
EOS ABS: 0.2 10*3/uL (ref 0.0–0.7)
Eosinophils Relative: 2.9 % (ref 0.0–5.0)
HCT: 45.8 % (ref 39.0–52.0)
Hemoglobin: 15.7 g/dL (ref 13.0–17.0)
LYMPHS PCT: 39.6 % (ref 12.0–46.0)
Lymphs Abs: 3 10*3/uL (ref 0.7–4.0)
MCHC: 34.2 g/dL (ref 30.0–36.0)
MCV: 78.8 fl (ref 78.0–100.0)
MONO ABS: 0.6 10*3/uL (ref 0.1–1.0)
Monocytes Relative: 7.7 % (ref 3.0–12.0)
NEUTROS ABS: 3.8 10*3/uL (ref 1.4–7.7)
Neutrophils Relative %: 49.5 % (ref 43.0–77.0)
PLATELETS: 323 10*3/uL (ref 150.0–400.0)
RBC: 5.82 Mil/uL — ABNORMAL HIGH (ref 4.22–5.81)
RDW: 14.6 % (ref 11.5–15.5)
WBC: 7.6 10*3/uL (ref 4.0–10.5)

## 2016-03-08 LAB — BASIC METABOLIC PANEL
BUN: 19 mg/dL (ref 6–23)
CALCIUM: 9.5 mg/dL (ref 8.4–10.5)
CHLORIDE: 102 meq/L (ref 96–112)
CO2: 31 meq/L (ref 19–32)
CREATININE: 1.61 mg/dL — AB (ref 0.40–1.50)
GFR: 60.86 mL/min (ref >60.00)
Glucose, Bld: 179 mg/dL — ABNORMAL HIGH (ref 70–99)
Potassium: 4.3 mEq/L (ref 3.5–5.1)
Sodium: 138 mEq/L (ref 135–145)

## 2016-03-08 MED ORDER — PREDNISONE 10 MG PO TABS: ORAL_TABLET | ORAL | Status: DC

## 2016-03-08 MED ORDER — FAMOTIDINE 20 MG PO TABS: ORAL_TABLET | ORAL | Status: DC

## 2016-03-08 MED ORDER — PANTOPRAZOLE SODIUM 40 MG PO TBEC
40.0000 mg | DELAYED_RELEASE_TABLET | Freq: Every day | ORAL | Status: DC
Start: 2016-03-08 — End: 2016-04-25

## 2016-03-08 MED ORDER — AMLODIPINE BESYLATE 10 MG PO TABS: 10.0000 mg | ORAL_TABLET | Freq: Every morning | ORAL | Status: DC

## 2016-03-08 MED ORDER — TRAMADOL HCL 50 MG PO TABS: ORAL_TABLET | ORAL | Status: DC

## 2016-03-08 NOTE — Progress Notes (Signed)
Subjective:     Patient ID: Ricky Moore, male   DOB: [birth year],    MRN: [id number]  HPI  50 yobm never smoker ? Asthma as child age 40 but since then just bothered by seasonal rhinitis esp in spring > fall allergy testing by [facility] Pos mold/ragweed/cockroach then onset of cough May of 2017 in setting of flare of rhinitis which resolved but persistent cough so rx with SABA by [facility] mid June and self ref to Pulmonary clinic 03/08/2016   03/08/2016 1st [place] [facility] office visit/ Aryn Kops   Chief Complaint  Patient presents with  . Pulmonary Consult    Self referral. Pt states he has had problems with bronchitis x 2 months. He c/o cough with bloody sputum and SOB.  SOB bothers him esp when he lies down.   onset of cough with typical rhinitis/ esp after supper but much worse after lie down and all night but better for an hour p saba / rx erythromycin and pred helped a lot.  Cough is productive of pinkish  mucus esp in am   No obvious day to day or daytime variability or assoc purulent sputum or mucus plugs or cp or chest tightness, subjective wheeze or overt sinus or hb symptoms. No unusual exp hx or h/o childhood pna/ asthma or knowledge of premature birth.  Sleeping ok without nocturnal  or early am exacerbation  of respiratory  c/o's or need for noct saba. Also denies any obvious fluctuation of symptoms with weather or environmental changes or other aggravating or alleviating factors except as outlined above   Current Medications, Allergies, Complete Past Medical History, Past Surgical History, Family History, and Social History were reviewed in [employer] record.  ROS  The following are not active complaints unless bolded sore throat, dysphagia, dental problems, itching, sneezing,  nasal congestion or excess/ purulent secretions, ear ache,   fever, chills, sweats, unintended wt loss, classically pleuritic or exertional cp,  orthopnea pnd or leg swelling, presyncope,  palpitations, abdominal pain, anorexia, nausea, vomiting, diarrhea  or change in bowel or bladder habits, change in stools or urine, dysuria,hematuria,  rash, arthralgias, visual complaints, headache, numbness, weakness or ataxia or problems with walking or coordination,  change in mood/affect or memory.           Review of Systems     Objective:   Physical Exam    amb obese hoarse bm nad  Wt Readings from Last 3 Encounters:  03/08/16 333 lb 9.6 oz (151.32 kg)  06/14/15 332 lb (150.594 kg)  06/12/15 332 lb (150.594 kg)    Vital signs reviewed   HEENT: nl dentition, turbinates, and oropharynx. Nl external ear canals without cough reflex   NECK :  without JVD/Nodes/TM/ nl carotid upstrokes bilaterally   LUNGS: no acc muscle use,  Nl contour chest with severe cough on insp  CV:  RRR  no s3 or murmur or increase in P2, no edema   ABD:  soft and nontender with nl inspiratory excursion in the supine position. No bruits or organomegaly, bowel sounds nl  MS:  Nl gait/ ext warm without deformities, calf tenderness, cyanosis or clubbing No obvious joint restrictions   SKIN: warm and dry without lesions    NEURO:  alert, approp, nl sensorium with  no motor deficits      CXR PA and Lateral:   03/08/2016 :    I personally reviewed images and agree with radiology impression as follows:   No active  cardiopulmonary disease.  Labs 03/08/2016 =  Cbc with dff/allergy profile/ Assessment:

## 2016-03-08 NOTE — Patient Instructions (Signed)
The key to effective treatment for your cough is eliminating the non-stop cycle of cough you're stuck in long enough to let your airway heal completely and then see if there is anything still making you cough once you stop the cough suppression, but this should take no more than 5 days to figure out  First take delsym two tsp every 12 hours and supplement if needed with  tramadol 50 mg up to 2 every 4 hours to suppress the urge to cough at all or even clear your throat. Swallowing water or using ice chips/non mint and menthol containing candies (such as lifesavers or sugarless jolly ranchers) are also effective.  You should rest your voice and avoid activities that you know make you cough.  Once you have eliminated the cough for 3 straight days try reducing the tramadol first,  then the delsym as tolerated.    Prednisone 10 mg take  4 each am x 2 days,   2 each am x 2 days,  1 each am x 2 days and stop (this is to eliminate allergies and inflammation from coughing)  Protonix (pantoprazole) Take 30-60 min before first meal of the day and Pepcid 20 mg one bedtime plus chlorpheniramine 4 mg x 2 at bedtime (both available over the counter)  until cough is completely gone for at least a week without the need for cough suppression  GERD (REFLUX)  is an extremely common cause of respiratory symptoms, many times with no significant heartburn at all.    It can be treated with medication, but also with lifestyle changes including avoidance of late meals, excessive alcohol, smoking cessation, and avoid fatty foods, chocolate, peppermint, colas, red wine, and acidic juices such as orange juice.  NO MINT OR MENTHOL PRODUCTS SO NO COUGH DROPS  USE HARD CANDY INSTEAD (jolley ranchers or Stover's or Lifesavers (all available in sugarless versions) NO OIL BASED VITAMINS - use powdered substitutes.    Please remember to go to the lab and x-ray department downstairs for your tests - we will call you with the results  when they are available.      Please schedule a follow up office visit in 2 weeks, sooner if needed

## 2016-03-08 NOTE — Progress Notes (Signed)
Quick Note:  lmtcb ______ 

## 2016-03-10 NOTE — Assessment & Plan Note (Signed)
Body mass index is 41.7   No results found for: TSH   Contributing to gerd tendency/ doe/reviewed the need and the process to achieve and maintain neg calorie balance > defer f/u primary care including intermittently monitoring thyroid status

## 2016-03-10 NOTE — Assessment & Plan Note (Addendum)
The most common causes of chronic cough in immunocompetent adults include the following: upper airway cough syndrome (UACS), previously referred to as postnasal drip syndrome (PNDS), which is caused by variety of rhinosinus conditions; (2) asthma; (3) GERD; (4) chronic bronchitis from cigarette smoking or other inhaled environmental irritants; (5) nonasthmatic eosinophilic bronchitis; and (6) bronchiectasis.   These conditions, singly or in combination, have accounted for up to 94% of the causes of chronic cough in prospective studies.   Other conditions have constituted no >6% of the causes in prospective studies These have included bronchogenic carcinoma, chronic interstitial pneumonia, sarcoidosis, left ventricular failure, ACEI-induced cough, and aspiration from a condition associated with pharyngeal dysfunction.    Chronic cough is often simultaneously caused by more than one condition. A single cause has been found from 38 to 82% of the time, multiple causes from 18 to 62%. Multiply caused cough has been the result of three diseases up to 42% of the time.      The cough on inspiration apparent on today's exam is typical of  Classic Upper airway cough syndrome, so named because it's frequently impossible to sort out how much is  CR/sinusitis with freq throat clearing (which can be related to primary GERD)   vs  causing  secondary (" extra esophageal")  GERD from wide swings in gastric pressure that occur with throat clearing, often  promoting self use of mint and menthol lozenges that reduce the lower esophageal sphincter tone and exacerbate the problem further in a cyclical fashion.   These are the same pts (now being labeled as having "irritable larynx syndrome" by some cough centers) who not infrequently have a history of having failed to tolerate ace inhibitors,  dry powder inhalers or biphosphonates or report having atypical reflux symptoms that don't respond to standard doses of PPI , and are  easily confused as having aecopd or asthma flares by even experienced allergists/ pulmonologists.   rec max rx for gerd/ cyclical cough then consider w/u for occult asthma if not well controlled when returns in 2 weeks      Reviewed with pt  Of the three most common causes of chronic cough, only one (GERD)  can actually cause the other two (asthma and post nasal drip syndrome)  and perpetuate the cylce of cough inducing airway trauma, inflammation, heightened sensitivity to reflux which is prompted by the cough itself via a cyclical mechanism.    This may partially respond to steroids and look like asthma and post nasal drainage but never erradicated completely unless the cough and the secondary reflux are eliminated, preferably both at the same time.  While not intuitively obvious, many patients with chronic low grade reflux do not cough until there is a secondary insult that disturbs the protective epithelial barrier and exposes sensitive nerve endings.  This can be viral or direct physical injury such as with an endotracheal tube.   The point is that once this occurs, it is difficult to eliminate using anything but a maximally effective acid suppression regimen at least in the short run, accompanied by an appropriate diet to address non acid GERD.   Total time devoted to counseling  = 35/14m review case with pt/ discussion of options/alternatives/ personally creating written instructions  in presence of pt  then going over those specific  Instructions directly with the pt including how to use all of the meds but in particular covering each new medication in detail and the difference between the maintenance/automatic meds and the  prns using an action plan format for the latter.

## 2016-03-11 ENCOUNTER — Telehealth: Payer: Self-pay | Admitting: Internal Medicine

## 2016-03-11 LAB — RESPIRATORY ALLERGY PROFILE REGION II ~~LOC~~
Allergen, Cedar tree, t12: 1.68 kU/L — ABNORMAL HIGH
Allergen, Comm Silver Birch, t9: <0.1 kU/L
Allergen, Cottonwood, t14: <0.1 kU/L
Allergen, Mouse Urine Protein, e78: <0.1 kU/L
Allergen, Mulberry, t76: <0.1 kU/L
Alternaria Alternata: <0.1 kU/L
Aspergillus fumigatus, m3: <0.1 kU/L
Cladosporium Herbarum: <0.1 kU/L
Common Ragweed: 0.81 kU/L — ABNORMAL HIGH
Dog Dander: <0.1 kU/L
IgE (Immunoglobulin E), Serum: 205 kU/L — ABNORMAL HIGH (ref <115)
Johnson Grass: <0.1 kU/L
Rough Pigweed  IgE: <0.1 kU/L

## 2016-03-11 NOTE — Progress Notes (Signed)
Quick Note:  Called spoke with pt. Reviewed results and recs. Pt voiced understanding and had no further questions. ______ 

## 2016-03-11 NOTE — Telephone Encounter (Signed)
Notes Recorded by Tanda Rockers, MD on 03/08/2016 at 12:26 PM Call pt: Reviewed cxr and no acute change so no change in recommendations made at [facility] spoke with pt. Reviewed results and recs. Pt voiced understanding and had no further questions.

## 2016-03-12 ENCOUNTER — Telehealth: Payer: Self-pay | Admitting: *Deleted

## 2016-03-12 NOTE — Telephone Encounter (Signed)
LMTCB

## 2016-03-12 NOTE — Telephone Encounter (Signed)
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

## 2016-03-13 NOTE — Telephone Encounter (Signed)
Spoke with pt and notified of results per Dr. Wert. Pt verbalized understanding and denied any questions. 

## 2016-03-13 NOTE — Telephone Encounter (Signed)
Pt returning call and said that its ok to leave msg on phone.Ricky Moore

## 2016-03-28 ENCOUNTER — Encounter: Payer: Self-pay | Admitting: Internal Medicine

## 2016-03-28 ENCOUNTER — Ambulatory Visit (INDEPENDENT_AMBULATORY_CARE_PROVIDER_SITE_OTHER): Payer: BLUE CROSS/BLUE SHIELD | Admitting: Internal Medicine

## 2016-03-28 VITALS — BP 180/122 | HR 104 | Temp 98.1°F | Ht 75.0 in | Wt 326.0 lb

## 2016-03-28 DIAGNOSIS — R05 Cough: Secondary | ICD-10-CM

## 2016-03-28 DIAGNOSIS — I10 Essential (primary) hypertension: Secondary | ICD-10-CM | POA: Diagnosis not present

## 2016-03-28 DIAGNOSIS — R058 Other specified cough: Secondary | ICD-10-CM

## 2016-03-28 MED ORDER — NEBIVOLOL HCL 10 MG PO TABS: 10.0000 mg | ORAL_TABLET | Freq: Every day | ORAL | Status: DC

## 2016-03-28 MED ORDER — FLUTTER DEVI: 0 refills | Status: DC

## 2016-03-28 MED ORDER — PREDNISONE 10 MG PO TABS: ORAL_TABLET | ORAL | 0 refills | Status: DC

## 2016-03-28 MED ORDER — ACETAMINOPHEN-CODEINE #3 300-30 MG PO TABS: ORAL_TABLET | ORAL | 0 refills | Status: DC

## 2016-03-28 NOTE — Progress Notes (Signed)
Subjective:     Patient ID: Ricky Moore, male   DOB: 1976-06-11,    MRN: [id number]    Brief patient profile:  39 yobm never smoker ? Asthma as child age 40 but since then just bothered by seasonal rhinitis esp in spring > fall allergy testing by [facility] Pos mold/ragweed/cockroach then onset of cough May of 2017 in setting of flare of rhinitis which resolved but persistent cough so rx with SABA by [facility] mid June and self ref to Pulmonary clinic 03/08/2016    History of Present Illness  03/08/2016 1st [place] [facility] office visit/ Lerlene Treadwell  Re cough May 2017  Chief Complaint  Patient presents with  . Pulmonary Consult    Self referral. Pt states he has had problems with bronchitis x 2 months. He c/o cough with bloody sputum and SOB.  SOB bothers him esp when he lies down.   onset of cough with typical rhinitis/ esp after supper but much worse after lie down and all night but better for an hour p saba / rx erythromycin and pred helped a lot.  Cough is productive of pinkish  mucus esp in am  rec The key to effective treatment for your cough is eliminating the non-stop cycle of cough   First take delsym two tsp every 12 hours and supplement if needed with  tramadol 50 mg up to 2 every 4 hours to suppress the urge to cough at all or even clear your throat.   Once you have eliminated the cough for 3 straight days try reducing the tramadol first,  then the delsym as tolerated.   Prednisone 10 mg take  4 each am x 2 days,   2 each am x 2 days,  1 each am x 2 days and stop (this is to eliminate allergies and inflammation from coughing) Protonix (pantoprazole) Take 30-60 min before first meal of the day and Pepcid 20 mg one bedtime plus chlorpheniramine 4 mg x 2 at bedtime (both available over the counter)  until cough is completely gone for at least a week without the need for cough suppression GERD diet   03/28/2016  f/u ov/Brice Kossman re: cough since May 2017  Chief Complaint  Patient presents with  .  Follow-up    SOB and cough have been progressively worse. He is still coughing up blood streakes sputum. He states when he lies down he hears "gurgling".   not clear he followed recs for tramadol which was supposed to be high dose for 3-5 days but says he ran out of them one day prior to OV/ albuterol not helping.  No obvious day to day or daytime variability or assoc purulent sputum or mucus plugs or cp or chest tightness, subjective wheeze or overt sinus or hb symptoms. No unusual exp hx or h/o childhood pna/ asthma or knowledge of premature birth.  Sleeping ok without nocturnal  or early am exacerbation  of respiratory  c/o's or need for noct saba. Also denies any obvious fluctuation of symptoms with weather or environmental changes or other aggravating or alleviating factors except as outlined above   Current Medications, Allergies, Complete Past Medical History, Past Surgical History, Family History, and Social History were reviewed in [employer] record.  ROS  The following are not active complaints unless bolded sore throat, dysphagia, dental problems, itching, sneezing,  nasal congestion or excess/ purulent secretions, ear ache,   fever, chills, sweats, unintended wt loss, classically pleuritic or exertional cp,  orthopnea  pnd or leg swelling, presyncope, palpitations, abdominal pain, anorexia, nausea, vomiting, diarrhea  or change in bowel or bladder habits, change in stools or urine, dysuria,hematuria,  rash, arthralgias, visual complaints, headache, numbness, weakness or ataxia or problems with walking or coordination,  change in mood/affect or memory.               Objective:   Physical Exam    amb obese hoarse bm nad   04/01/2016      326  03/08/16 333 lb 9.6 oz (151.32 kg)  06/14/15 332 lb (150.594 kg)  06/12/15 332 lb (150.594 kg)    Vital signs reviewed / bp elevation noted   HEENT: nl dentition, turbinates, and oropharynx. Nl external ear canals  without cough reflex   NECK :  without JVD/Nodes/TM/ nl carotid upstrokes bilaterally   LUNGS: no acc muscle use,  Nl contour chest with less severe cough on insp  CV:  RRR  no s3 or murmur or increase in P2, no edema   ABD:  soft and nontender with nl inspiratory excursion in the supine position. No bruits or organomegaly, bowel sounds nl  MS:  Nl gait/ ext warm without deformities, calf tenderness, cyanosis or clubbing No obvious joint restrictions   SKIN: warm and dry without lesions    NEURO:  alert, approp, nl sensorium with  no motor deficits      CXR PA and Lateral:   03/08/2016 :    I personally reviewed images and agree with radiology impression as follows:   No active cardiopulmonary disease.    Assessment:

## 2016-03-28 NOTE — Patient Instructions (Addendum)
The key to effective treatment for your cough is eliminating the non-stop cycle of cough you're stuck in long enough to let your airway heal completely and then see if there is anything still making you cough once you stop the cough suppression, but this should take no more than 5 days to figure out  First use the flutter valve take delsym two tsp every 12 hours and supplement if needed with  Tylenol #3 mg up to 2 every 4 hours to suppress the urge to cough at all or even clear your throat. Swallowing water or using ice chips/non mint and menthol containing candies (such as lifesavers or sugarless jolly ranchers) are also effective.  You should rest your voice and avoid activities that you know make you cough.  Once you have eliminated the cough for 3 straight days try reducing the tylenol #3 first,  then the delsym as tolerated.    Prednisone 10 mg take  4 each am x 2 days,   2 each am x 2 days,  1 each am x 2 days and stop (this is to eliminate allergies and inflammation from coughing)  Protonix (pantoprazole) Take 30-60 min before first meal of the day and Pepcid 20 mg one bedtime plus chlorpheniramine 4 mg x 2 at bedtime (both available over the counter)  until cough is completely gone for at least a week without the need for cough suppression  GERD (REFLUX)  is an extremely common cause of respiratory symptoms, many times with no significant heartburn at all.    It can be treated with medication, but also with lifestyle changes including avoidance of late meals, excessive alcohol, smoking cessation, and avoid fatty foods, chocolate, peppermint, colas, red wine, and acidic juices such as orange juice.  NO MINT OR MENTHOL PRODUCTS SO NO COUGH DROPS   USE HARD CANDY INSTEAD (jolley ranchers or Stover's or Lifesavers (all available in sugarless versions) NO OIL BASED VITAMINS - use powdered substitutes.  Stop norvasc    Increase loniten to 10 mg twice daily  And bystolic 10 mg daily x 2 weeks  then return with all meds in hand

## 2016-04-01 ENCOUNTER — Encounter: Payer: Self-pay | Admitting: Internal Medicine

## 2016-04-01 MED ORDER — MINOXIDIL 10 MG PO TABS: 10.0000 mg | ORAL_TABLET | Freq: Two times a day (BID) | ORAL | Status: DC

## 2016-04-01 NOTE — Assessment & Plan Note (Addendum)
Onset May 2017  - Allergy profile 03/08/2016 >  Eos 0.2 /  IgE 205 Pos ragweed/ cedar   - Added flutter valve 03/28/2016    I had an extended discussion with the patient reviewing all relevant studies completed to date and  lasting 15 to 20 minutes of a 25 minute visit on the following ongoing concerns:   The standardized cough guidelines published in Chest by Lissa Morales in 2006 are still the best available and consist of a multiple step process (up to 12!) , not a single office visit,  and are intended  to address this problem logically,  with an alogrithm dependent on response to empiric treatment at  each progressive step  to determine a specific diagnosis with  minimal addtional testing needed. Therefore if adherence is an issue or can't be accurately verified,  it's very unlikely the standard evaluation and treatment will be successful here.    Furthermore, response to therapy (other than acute cough suppression, which should only be used short term with avoidance of narcotic containing cough syrups if possible), can be a gradual process for which the patient is not likely to  perceive immediate benefit.  Unlike going to an eye doctor where the best perscription is almost always the first one and is immediately effective, this is almost never the case in the management of chronic cough syndromes. Therefore the patient needs to commit up front to consistently adhere to recommendations  for up to 6 weeks of therapy directed at the likely underlying problem(s) before the response can be reasonably evaluated.   Will try again to eliminate cyclical cough with flutter valve/ tylenol #3   Each maintenance medication was reviewed in detail including most importantly the difference between maintenance and as needed and under what circumstances the prns are to be used.  Please see instructions for details which were reviewed in writing and the patient given a copy.

## 2016-04-01 NOTE — Assessment & Plan Note (Addendum)
Not Adequate control on present rx, reviewed > try bystolic 10 mg qd x 2 weeks and increase minoxidil to 10 mg bid and d/c norvasc (since both are vasodilators) and f/u here then.

## 2016-04-05 ENCOUNTER — Telehealth: Payer: Self-pay | Admitting: Internal Medicine

## 2016-04-05 NOTE — Telephone Encounter (Signed)
Spoke with the pt  He states he is much improved, although still has some min hemoptysis daily  He is ready to go back to work as of today  I advised MW out of the office until 8/7 and so then he changed his mind and wants to return on Tues 8/4  MW- is this okay to provide a letter for him? He is also asking how long you think the hemoptysis will last  He is okay with waiting until Monday for response  Please advise, thanks!  Last AVS:          The key to effective treatment for your cough is eliminating the non-stop cycle of cough you're stuck in long enough to let your airway heal completely and then see if there is anything still making you cough once you stop the cough suppression, but this should take no more than 5 days to figure out  First use the flutter valve take delsym two tsp every 12 hours and supplement if needed with Tylenol #3 mg up to 2 every 4 hours to suppress the urge to cough at all or even clear your throat. Swallowing water or using ice chips/non mint and menthol containing candies (such as lifesavers or sugarless jolly ranchers) are also effective. You should rest your voice and avoid activities that you know make you cough.  Once you have eliminated the cough for 3 straight days try reducing the tylenol #3 first, then the delsym as tolerated.  Prednisone 10 mg take 4 each am x 2 days, 2 each am x 2 days, 1 each am x 2 days and stop (this is to eliminate allergies and inflammation from coughing)  Protonix (pantoprazole) Take 30-60 min before first meal of the day and Pepcid 20 mg one bedtime plus chlorpheniramine 4 mg x 2 at bedtime (both available over the counter) until cough is completely gone for at least a week without the need for cough suppression  GERD (REFLUX) is an extremely common cause of respiratory symptoms, many times with no significant heartburn at all.  It can be treated with medication, but also with lifestyle changes including avoidance of late  meals, excessive alcohol, smoking cessation, and avoid fatty foods, chocolate, peppermint, colas, red wine, and acidic juices such as orange juice.  NO MINT OR MENTHOL PRODUCTS SO NO COUGH DROPS  USE HARD CANDY INSTEAD (jolley ranchers or Stover's or Lifesavers (all available in sugarless versions) NO OIL BASED VITAMINS - use powdered substitutes.  Stop norvasc  Increase loniten to 10 mg twice daily And bystolic 10 mg daily x 2 weeks then return with all meds in hand

## 2016-04-07 NOTE — Telephone Encounter (Signed)
My Link Snuffer is that the cough is driving the hemoptysis so when the cough stops completely so will the bleeding.    Reviewing his med list looks like he uses excedrin with asa - should avoid all meds with asa/advil or any nsaids in them - only tylenol ok

## 2016-04-08 NOTE — Telephone Encounter (Signed)
Fax [phone] this is where it should be faxed to it needs ov notes also exam findings, any test results, and any limitations and estimated return date and claim # [id number] pt # is [phone]

## 2016-04-08 NOTE — Telephone Encounter (Signed)
Called and advised patient of Dr. Gustavus Bryant recommendations. Patient said that he would call our office back with information to fax the letter to. Will create letter and fax when patient returns call.  Awaiting call back from patient.

## 2016-04-08 NOTE — Telephone Encounter (Signed)
MW pt is aware of recs for the cough and avoid nsaids.  He wanted to know when should he return to work, and he will need a note to do so.  thanks

## 2016-04-08 NOTE — Telephone Encounter (Signed)
04/09/16 is fine  - ok to write note

## 2016-04-08 NOTE — Telephone Encounter (Signed)
Completed note and faxed along with last OV note to [phone] per patient's request.   Patient aware. Nothing further needed.

## 2016-04-25 ENCOUNTER — Encounter: Payer: Self-pay | Admitting: Internal Medicine

## 2016-04-25 ENCOUNTER — Ambulatory Visit (INDEPENDENT_AMBULATORY_CARE_PROVIDER_SITE_OTHER): Payer: BLUE CROSS/BLUE SHIELD | Admitting: Internal Medicine

## 2016-04-25 VITALS — BP 126/86 | HR 98 | Ht 75.0 in | Wt 328.0 lb

## 2016-04-25 DIAGNOSIS — I10 Essential (primary) hypertension: Secondary | ICD-10-CM | POA: Diagnosis not present

## 2016-04-25 DIAGNOSIS — R05 Cough: Secondary | ICD-10-CM | POA: Diagnosis not present

## 2016-04-25 DIAGNOSIS — R058 Other specified cough: Secondary | ICD-10-CM

## 2016-04-25 NOTE — Progress Notes (Signed)
Subjective:     Patient ID: Ricky Moore, male   DOB: [birth year],    MRN: [id number]    Brief patient profile:  39 yobm never smoker ? Asthma as child age 40 but since then just bothered by seasonal rhinitis esp in spring > fall allergy testing by [facility] Pos mold/ragweed/cockroach then onset of cough May of 2017 in setting of flare of rhinitis which resolved but persistent cough so rx with SABA by [facility] mid June and self ref to Pulmonary clinic 03/08/2016    History of Present Illness  03/08/2016 1st [facility] office visit/ Ricky Moore  Re cough May 2017  Chief Complaint  Patient presents with  . Pulmonary Consult    Self referral. Pt states he has had problems with bronchitis x 2 months. He c/o cough with bloody sputum and SOB.  SOB bothers him esp when he lies down.   onset of cough with typical rhinitis/ esp after supper but much worse after lie down and all night but better for an hour p saba / rx erythromycin and pred helped a lot.  Cough is productive of pinkish  mucus esp in am  rec The key to effective treatment for your cough is eliminating the non-stop cycle of cough   First take delsym two tsp every 12 hours and supplement if needed with  tramadol 50 mg up to 2 every 4 hours to suppress the urge to cough at all or even clear your throat.   Once you have eliminated the cough for 3 straight days try reducing the tramadol first,  then the delsym as tolerated.   Prednisone 10 mg take  4 each am x 2 days,   2 each am x 2 days,  1 each am x 2 days and stop (this is to eliminate allergies and inflammation from coughing) Protonix (pantoprazole) Take 30-60 min before first meal of the day and Pepcid 20 mg one bedtime plus chlorpheniramine 4 mg x 2 at bedtime (both available over the counter)  until cough is completely gone for at least a week without the need for cough suppression GERD diet   03/28/2016  f/u ov/Ricky Moore re: cough since May 2017  Chief Complaint  Patient presents with  .  Follow-up    SOB and cough have been progressively worse. He is still coughing up blood streakes sputum. He states when he lies down he hears "gurgling".   not clear he followed recs for tramadol which was supposed to be high dose for 3-5 days but says he ran out of them one day prior to OV/ albuterol not helping. rec  First use the flutter valve take delsym two tsp every 12 hours and supplement if needed with  Tylenol #3 mg up to 2 every 4 hours  Prednisone 10 mg take  4 each am x 2 days,   2 each am x 2 days,  1 each am x 2 days and stop (this is to eliminate allergies and inflammation from coughing) Protonix (pantoprazole) Take 30-60 min before first meal of the day and Pepcid 20 mg one bedtime plus chlorpheniramine 4 mg x 2 at bedtime (both available over the counter)  until cough is completely gone for at least a week without the need for cough suppression GERD (REFLUX)   Stop norvasc  Increase loniten to 10 mg twice daily  And bystolic 10 mg daily x 2 weeks then return with all meds in hand    04/25/2016  f/u ov/Ricky Moore re:  chronic cough, severe hbp/ easily confused with meds and on bystolic and lopressor which was not the rec  Chief Complaint  Patient presents with  . Follow-up    Cough is much improved. He only has am cough- prod with "blob of blood".      Not limited by breathing from desired activities  / reports has had intermittent epistaxis since onset of cough - most recent one week prior to OV    No obvious day to day or daytime variability or assoc purulent sputum or mucus plugs or cp or chest tightness, subjective wheeze or overt  hb symptoms. No unusual exp hx or h/o childhood pna/ asthma or knowledge of premature birth.  Sleeping ok without nocturnal  or early am exacerbation  of respiratory  c/o's or need for noct saba. Also denies any obvious fluctuation of symptoms with weather or environmental changes or other aggravating or alleviating factors except as outlined above    Current Medications, Allergies, Complete Past Medical History, Past Surgical History, Family History, and Social History were reviewed in [employer] record.  ROS  The following are not active complaints unless bolded sore throat, dysphagia, dental problems, itching, sneezing,  nasal congestion or excess/ purulent secretions, ear ache,   fever, chills, sweats, unintended wt loss, classically pleuritic or exertional cp,  orthopnea pnd or leg swelling, presyncope, palpitations, abdominal pain, anorexia, nausea, vomiting, diarrhea  or change in bowel or bladder habits, change in stools or urine, dysuria,hematuria,  rash, arthralgias, visual complaints, headache, numbness, weakness or ataxia or problems with walking or coordination,  change in mood/affect or memory.               Objective:   Physical Exam   amb obese hoarse bm nad  04/25/2016       328  04/01/2016      326  03/08/16 333 lb 9.6 oz (151.32 kg)  06/14/15 332 lb (150.594 kg)  06/12/15 332 lb (150.594 kg)    Vital signs reviewed / bp improved   HEENT: nl dentition, turbinates, and oropharynx. Nl external ear canals without cough reflex   NECK :  without JVD/Nodes/TM/ nl carotid upstrokes bilaterally   LUNGS: no acc muscle use,  Nl contour chest - clear bilaterally s cough   CV:  RRR  no s3 or murmur or increase in P2, no edema   ABD:  soft and nontender with nl inspiratory excursion in the supine position. No bruits or organomegaly, bowel sounds nl  MS:  Nl gait/ ext warm without deformities, calf tenderness, cyanosis or clubbing No obvious joint restrictions   SKIN: warm and dry without lesions    NEURO:  alert, approp, nl sensorium with  no motor deficits      CXR PA and Lateral:   03/08/2016 :    I personally reviewed images and agree with radiology impression as follows:   No active cardiopulmonary disease.    Assessment:

## 2016-04-25 NOTE — Assessment & Plan Note (Signed)
Much better but on multiple BB now with pulse in 90s so reluctant to rec change but since asthma is in ddx   prefer in this setting: Bystolic, the most beta -1  selective Beta blocker available in sample form, with bisoprolol the most selective generic choice  on the market.  Follow up per Primary Care planned  / no change in meantime

## 2016-04-25 NOTE — Assessment & Plan Note (Addendum)
Onset May 2017  - Allergy profile 03/08/2016 >  Eos 0.2 /  IgE 205 Pos ragweed/ cedar   - Added flutter valve 03/28/2016  - Sinus CT 04/25/2016 >>>   Improved though likely has underlying rhinitis and even sinusitis but as in many cases of chronic cough suspect secondary reflux and since just turning the corner s need for cough suppression rec no change rx x 6 more weeks then consider taper.  ? Role for trial of singulair if flares  I had an extended discussion with the patient reviewing all relevant studies completed to date and  lasting 15 to 20 minutes of a 25 minute visit    Each maintenance medication was reviewed in detail including most importantly the difference between maintenance and prns and under what circumstances the prns are to be triggered using an action plan format that is not reflected in the computer generated alphabetically organized AVS.    Please see instructions for details which were reviewed in writing and the patient given a copy highlighting the part that I personally wrote and discussed at today's ov.

## 2016-04-25 NOTE — Patient Instructions (Addendum)
Continue bystolic 10 mg daily and the minoxidil 10 mg /lopressor twice daily let primary doctor refill according to his prefernce (bystolic and lopressor are redundant and he will need to pick one)   Please see patient coordinator before you leave today  to schedule sinus CT   Continue Pantoprazole (protonix) 40 mg   Take  30-60 min before first meal of the day and Pepcid (famotidine)  20 mg one @  bedtime until return to office - this is the best way to tell whether stomach acid is contributing to your problem.    Please schedule a follow up office visit in 6 weeks, call sooner if needed

## 2016-04-29 ENCOUNTER — Ambulatory Visit (HOSPITAL_BASED_OUTPATIENT_CLINIC_OR_DEPARTMENT_OTHER): Payer: BLUE CROSS/BLUE SHIELD

## 2016-04-29 ENCOUNTER — Other Ambulatory Visit: Payer: Self-pay | Admitting: Cardiovascular Disease

## 2016-04-29 ENCOUNTER — Telehealth: Payer: Self-pay | Admitting: Internal Medicine

## 2016-04-29 NOTE — Telephone Encounter (Signed)
[place] x 1 - Pt needs to call [phone] ([facility] ) to reschedule sinus CT

## 2016-04-30 NOTE — Telephone Encounter (Signed)
lmtcb x2 for pt. 

## 2016-05-01 NOTE — Telephone Encounter (Signed)
lmtcb x3 for pt. 

## 2016-05-02 NOTE — Telephone Encounter (Signed)
Called and left detailed message and number to call to reschedule the ct sinus for the pt.

## 2016-05-09 ENCOUNTER — Ambulatory Visit (HOSPITAL_BASED_OUTPATIENT_CLINIC_OR_DEPARTMENT_OTHER): Payer: BLUE CROSS/BLUE SHIELD

## 2016-05-23 ENCOUNTER — Ambulatory Visit (HOSPITAL_BASED_OUTPATIENT_CLINIC_OR_DEPARTMENT_OTHER): Payer: BLUE CROSS/BLUE SHIELD

## 2016-05-23 ENCOUNTER — Telehealth: Payer: Self-pay | Admitting: Internal Medicine

## 2016-05-23 NOTE — Telephone Encounter (Signed)
Will forward to MW as an FYI 

## 2016-06-06 ENCOUNTER — Ambulatory Visit: Payer: BLUE CROSS/BLUE SHIELD | Admitting: Internal Medicine

## 2018-02-10 ENCOUNTER — Encounter: Payer: Self-pay | Admitting: Emergency Medicine

## 2018-02-10 ENCOUNTER — Emergency Department
Admission: EM | Admit: 2018-02-10 | Discharge: 2018-02-10 | Disposition: A | Discharge status: Home / Self Care | Payer: BLUE CROSS/BLUE SHIELD | Attending: Emergency Medicine | Admitting: Emergency Medicine

## 2018-02-10 DIAGNOSIS — N189 Chronic kidney disease, unspecified: Secondary | ICD-10-CM | POA: Insufficient documentation

## 2018-02-10 DIAGNOSIS — E1165 Type 2 diabetes mellitus with hyperglycemia: Secondary | ICD-10-CM | POA: Diagnosis not present

## 2018-02-10 DIAGNOSIS — I129 Hypertensive chronic kidney disease with stage 1 through stage 4 chronic kidney disease, or unspecified chronic kidney disease: Secondary | ICD-10-CM | POA: Insufficient documentation

## 2018-02-10 DIAGNOSIS — Z79899 Other long term (current) drug therapy: Secondary | ICD-10-CM | POA: Diagnosis not present

## 2018-02-10 DIAGNOSIS — Z794 Long term (current) use of insulin: Secondary | ICD-10-CM | POA: Insufficient documentation

## 2018-02-10 DIAGNOSIS — R739 Hyperglycemia, unspecified: Secondary | ICD-10-CM

## 2018-02-10 LAB — CBC
HCT: 47.2 % (ref 40.0–52.0)
Hemoglobin: 16.3 g/dL (ref 13.0–18.0)
MCH: 26.8 pg (ref 26.0–34.0)
MCHC: 34.6 g/dL (ref 32.0–36.0)
MCV: 77.4 fL — ABNORMAL LOW (ref 80.0–100.0)
PLATELETS: 294 10*3/uL (ref 150–440)
RBC: 6.1 MIL/uL — ABNORMAL HIGH (ref 4.40–5.90)
RDW: 14.1 % (ref 11.5–14.5)
WBC: 6.4 10*3/uL (ref 3.8–10.6)

## 2018-02-10 LAB — BASIC METABOLIC PANEL
Anion gap: 10 (ref 5–15)
BUN: 19 mg/dL (ref 6–20)
CHLORIDE: 98 mmol/L — AB (ref 101–111)
CO2: 25 mmol/L (ref 22–32)
CREATININE: 1.61 mg/dL — AB (ref 0.61–1.24)
Calcium: 9.1 mg/dL (ref 8.9–10.3)
GFR, EST AFRICAN AMERICAN: 59 mL/min — AB (ref >60)
GFR, EST NON AFRICAN AMERICAN: 51 mL/min — AB (ref >60)
Glucose, Bld: 434 mg/dL — ABNORMAL HIGH (ref 65–99)
POTASSIUM: 4.6 mmol/L (ref 3.5–5.1)
SODIUM: 133 mmol/L — AB (ref 135–145)

## 2018-02-10 LAB — GLUCOSE, CAPILLARY
GLUCOSE-CAPILLARY: 440 mg/dL — AB (ref 65–99)
Glucose-Capillary: 341 mg/dL — ABNORMAL HIGH (ref 65–99)
Glucose-Capillary: 322 mg/dL — ABNORMAL HIGH (ref 65–99)
Glucose-Capillary: 226 mg/dL — ABNORMAL HIGH (ref 65–99)

## 2018-02-10 LAB — URINALYSIS, COMPLETE (UACMP) WITH MICROSCOPIC
BILIRUBIN URINE: NEGATIVE
Bacteria, UA: NONE SEEN
HGB URINE DIPSTICK: NEGATIVE
Ketones, ur: 5 mg/dL — AB
LEUKOCYTES UA: NEGATIVE
Nitrite: NEGATIVE
Protein, ur: NEGATIVE mg/dL
SPECIFIC GRAVITY, URINE: 1.037 — AB (ref 1.005–1.030)
Squamous Epithelial / LPF: NONE SEEN (ref 0–5)
pH: 5 (ref 5.0–8.0)

## 2018-02-10 MED ORDER — SODIUM CHLORIDE 0.9 % IV BOLUS
1000.0000 mL | Freq: Once | INTRAVENOUS | Status: AC
  Administered 2018-02-10: 1000 mL via INTRAVENOUS

## 2018-02-10 MED ORDER — INSULIN ASPART 100 UNIT/ML ~~LOC~~ SOLN
8.0000 [IU] | Freq: Once | SUBCUTANEOUS | Status: AC
  Administered 2018-02-10: 8 [IU] via INTRAVENOUS
  Filled 2018-02-10: qty 1

## 2018-02-10 MED ORDER — INSULIN ASPART 100 UNIT/ML ~~LOC~~ SOLN: 8.0000 [IU] | Freq: Once | SUBCUTANEOUS | Status: DC

## 2018-02-10 MED ORDER — INSULIN ASPART 100 UNIT/ML ~~LOC~~ SOLN
8.0000 [IU] | Freq: Once | SUBCUTANEOUS | Status: AC
  Administered 2018-02-10: 8 [IU] via SUBCUTANEOUS
  Filled 2018-02-10: qty 1

## 2018-02-10 NOTE — ED Provider Notes (Signed)
[facility] Emergency Department Provider Note ____________________________________________   First MD Initiated Contact with Patient 02/10/18 1135     (approximate)  I have reviewed the triage vital signs and the nursing notes.   HISTORY  Chief Complaint Hyperglycemia    HPI Zvi Duplantis is a 42 y.o. male with PMH as noted below who presents with hyperglycemia over the last month, worse in the last several days, with readings in the mid to high 300s over the last few days despite taking his Janumet and Levemir (and increasing his Levemir dose) as prescribed.  Patient reports associated blurred vision, headaches, and generalized fatigue.  He also states that his blood pressure has been high.  Past Medical History:  Diagnosis Date  . Bladder tumor   . Complication of anesthesia    SLIGHT NAUSEA  . Diabetes mellitus without complication ([place])   . GERD (gastroesophageal reflux disease)    RELATED TO CERTAIN FOODS  . Hematuria   . Hypertension   . Sickle cell trait ([place])   . Sleep apnea    USES C PAP    Patient Active Problem List   Diagnosis Date Noted  . Morbid (severe) obesity due to excess calories ([place]) 03/10/2016  . Upper airway cough syndrome 03/08/2016  . Acute renal failure ([place]) 07/14/2014  . New onset type 2 diabetes mellitus ([place]) 07/14/2014  . Mass of bladder 07/14/2014  . CKD (chronic kidney disease) 07/14/2014  . Essential hypertension, benign 07/14/2014  . Hyperglycemia 07/14/2014    Past Surgical History:  Procedure Laterality Date  . CYSTOSCOPY WITH RETROGRADE PYELOGRAM, URETEROSCOPY AND STENT PLACEMENT Bilateral 06/14/2015   Procedure: CYSTOSCOPY WITH BIOPSY, FULGERATION,  BILATERAL RETROGRADE PYELOGRAM;  Surgeon: Alexis Frock, MD;  Location: [facility] ORS;  Service: Urology;  Laterality: Bilateral;  . TONSILLECTOMY  2008  . TRANSURETHRAL RESECTION OF BLADDER TUMOR WITH GYRUS (TURBT-GYRUS)  2010  . VASECTOMY Bilateral 06/14/2015     Procedure: VASECTOMY;  Surgeon: Alexis Frock, MD;  Location: [facility] ORS;  Service: Urology;  Laterality: Bilateral;    Prior to Admission medications   Medication Sig Start Date End Date Taking? Authorizing Provider  acetaminophen-codeine (TYLENOL #3) 300-30 MG tablet One to  two every 4 hours as needed for cough Patient not taking: Reported on 04/25/2016 03/28/16   Tanda Rockers, MD  aspirin-acetaminophen-caffeine (EXCEDRIN MIGRAINE) [phone] MG per tablet Take 1 tablet by mouth every 6 (six) hours as needed for headache.    [provider]  Chlorpheniramine-DM (CORICIDIN HBP COUGH/COLD PO) as directed.    [provider]  diphenhydramine-acetaminophen (TYLENOL PM) 25-500 MG TABS tablet Take 1 tablet by mouth at bedtime as needed.    [provider]  famotidine (PEPCID) 20 MG tablet One at bedtime 03/08/16   Tanda Rockers, MD  hydrochlorothiazide (HYDRODIURIL) 25 MG tablet Take 25 mg by mouth every morning.    [provider]  insulin detemir (LEVEMIR) 100 UNIT/ML injection Inject 30 Units into the skin at bedtime.    [provider]  metoprolol (LOPRESSOR) 50 MG tablet Take 1 tablet by mouth 2 (two) times daily. 04/13/16   [provider]  minoxidil (LONITEN) 10 MG tablet Take 1 tablet (10 mg total) by mouth 2 (two) times daily. 04/01/16   Tanda Rockers, MD  nebivolol (BYSTOLIC) 10 MG tablet Take 10 mg by mouth daily.    [provider]  PROAIR HFA 108 (90 Base) MCG/ACT inhaler Inhale 1 puff into the lungs every 4 (four)  hours as needed. 02/19/16   [provider]  Respiratory Therapy Supplies (FLUTTER) DEVI Use as directed 03/28/16   Tanda Rockers, MD  sitaGLIPtin-metformin (JANUMET) 50-500 MG tablet Take 1 tablet by mouth 2 (two) times daily.    [provider]    Allergies Other  Family History  Problem Relation Age of Onset  . Diabetes Mellitus II Maternal Aunt     Social History Social History    Tobacco Use  . Smoking status: Never Smoker  . Smokeless tobacco: Never Used  Substance Use Topics  . Alcohol use: Yes    Alcohol/week: 0.0 oz    Comment: OCCASIONAL  . Drug use: No    Review of Systems  Constitutional: No fever.  Positive for fatigue. Eyes: Positive for blurred vision. ENT: No sore throat. Cardiovascular: Denies chest pain. Respiratory: Denies shortness of breath. Gastrointestinal: No vomiting.  Genitourinary: Positive for polyuria.  Musculoskeletal: Negative for back pain. Skin: Negative for rash. Neurological: Negative for headache.   ____________________________________________   PHYSICAL EXAM:  VITAL SIGNS: ED Triage Vitals  Enc Vitals Group     BP 02/10/18 1107 (!) 171/110     Pulse Rate 02/10/18 1107 92     Resp 02/10/18 1107 17     Temp 02/10/18 1107 98.9 F (37.2 C)     Temp Source 02/10/18 1107 Oral     SpO2 02/10/18 1107 99 %     Weight 02/10/18 1025 (!) 309 lb (140.2 kg)     Height 02/10/18 1025 6\' 3"  (1.905 m)     Head Circumference --      Peak Flow --      Pain Score 02/10/18 1024 10     Pain Loc --      Pain Edu? --      Excl. in [place]? --     Constitutional: Alert and oriented. Well appearing and in no acute distress. Eyes: Conjunctivae are normal.  Head: Atraumatic. Nose: No congestion/rhinnorhea. Mouth/Throat: Mucous membranes are slightly dry.   Neck: Normal range of motion.  Cardiovascular:  Good peripheral circulation. Respiratory: Normal respiratory effort.  Gastrointestinal: No distention.  Musculoskeletal:  Extremities warm and well perfused.  Neurologic:  Normal speech and language. No gross focal neurologic deficits are appreciated.  Skin:  Skin is warm and dry. No rash noted. Psychiatric: Mood and affect are normal. Speech and behavior are normal.  ____________________________________________   LABS (all labs ordered are listed, but only abnormal results are displayed)  Labs Reviewed  BASIC METABOLIC  PANEL - Abnormal; Notable for the following components:      Result Value   Sodium 133 (*)    Chloride 98 (*)    Glucose, Bld 434 (*)    Creatinine, Ser 1.61 (*)    GFR calc non Af Amer 51 (*)    GFR calc Af Amer 59 (*)    All other components within normal limits  CBC - Abnormal; Notable for the following components:   RBC 6.10 (*)    MCV 77.4 (*)    All other components within normal limits  URINALYSIS, COMPLETE (UACMP) WITH MICROSCOPIC - Abnormal; Notable for the following components:   Color, Urine YELLOW (*)    APPearance CLEAR (*)    Specific Gravity, Urine 1.037 (*)    Glucose, UA >=500 (*)    Ketones, ur 5 (*)    All other components within normal limits  GLUCOSE, CAPILLARY - Abnormal; Notable for the following components:   Glucose-Capillary  440 (*)    All other components within normal limits  GLUCOSE, CAPILLARY - Abnormal; Notable for the following components:   Glucose-Capillary 341 (*)    All other components within normal limits  GLUCOSE, CAPILLARY - Abnormal; Notable for the following components:   Glucose-Capillary 322 (*)    All other components within normal limits  GLUCOSE, CAPILLARY - Abnormal; Notable for the following components:   Glucose-Capillary 226 (*)    All other components within normal limits  CBG MONITORING, ED  CBG MONITORING, ED   ____________________________________________  EKG   ____________________________________________  RADIOLOGY    ____________________________________________   PROCEDURES  Procedure(s) performed: No  Procedures  Critical Care performed: No ____________________________________________   INITIAL IMPRESSION / ASSESSMENT AND PLAN / ED COURSE  Pertinent labs & imaging results that were available during my care of the patient were reviewed by me and considered in my medical decision making (see chart for details).  42 year old male with history of diabetes on Janumet and Levemir presents with  hyperglycemia over the last month and associated symptoms of mild blurred vision and fatigue.   I reviewed the past medical records in Epic and confirmed that the patient was seen in urgent care this morning, had glucose readings as high as 375, and was referred to the emergency department.  On exam, the patient is extremely well-appearing, he is hypertensive but other vital signs are normal, and the remainder of the exam is as described above.  Presentation is consistent with hyperglycemia.  There is no clinical evidence for DKA.  Will obtain labs and UA to rule out DKA, give IV fluids, possibly give insulin, and reassess.  Anticipate likely discharge home with close PMD follow-up.  ----------------------------------------- 1:53 PM on 02/10/2018 -----------------------------------------  UA shows small ketones, however patient has no acidosis or anion gap.  There is no evidence for DKA.  Glucose is still in the 300s after fluids, so I will give insulin as well.  ----------------------------------------- 4:21 PM on 02/10/2018 -----------------------------------------  Patient's glucose is now in the low 200s.  He is feeling much better and would like to go home.  Blood pressure still elevated but patient continues to have no signs or symptoms of hypertensive urgency or indication for emergent treatment.  I instructed him to continue his current dose of Levemir and his other diabetes medications as well as his blood pressure medications.  He will call to follow-up with his doctor this week, and has an appointment arranged for next week already.  Return precautions given, and the patient expresses understanding. ____________________________________________   FINAL CLINICAL IMPRESSION(S) / ED DIAGNOSES  Final diagnoses:  Hyperglycemia      NEW MEDICATIONS STARTED DURING THIS VISIT:  New Prescriptions   No medications on file     Note:  This document was prepared using Dragon voice  recognition software and may include unintentional dictation errors.    Arta Silence, MD 02/10/18 1622

## 2018-02-10 NOTE — Discharge Instructions (Addendum)
Continue to take the higher dose of Levemir that you are currently on as well as your other diabetes and blood pressure medications.  Keep a close watch on your glucose over the next several days.  If he continues to increase he should call your primary care doctor, or if it increases further or is not readable return to the emergency department.  Return to the ER as well for any new or worsening blurred vision, weakness, lightheadedness, fevers, vomiting, or any other new or worsening symptoms that concern you.

## 2018-02-10 NOTE — ED Triage Notes (Signed)
Pt reports is a diabetic and was seen at [facility] and advised to come to the ED.

## 2018-02-10 NOTE — ED Notes (Signed)
Blood sugar was at 226

## 2019-07-15 DIAGNOSIS — I159 Secondary hypertension, unspecified: Secondary | ICD-10-CM | POA: Diagnosis not present

## 2019-07-15 DIAGNOSIS — M545 Low back pain: Secondary | ICD-10-CM | POA: Diagnosis not present

## 2019-07-15 DIAGNOSIS — M549 Dorsalgia, unspecified: Secondary | ICD-10-CM | POA: Diagnosis not present

## 2020-01-21 DIAGNOSIS — Z794 Long term (current) use of insulin: Secondary | ICD-10-CM | POA: Diagnosis not present

## 2020-01-21 DIAGNOSIS — I129 Hypertensive chronic kidney disease with stage 1 through stage 4 chronic kidney disease, or unspecified chronic kidney disease: Secondary | ICD-10-CM | POA: Diagnosis not present

## 2020-01-21 DIAGNOSIS — E7800 Pure hypercholesterolemia, unspecified: Secondary | ICD-10-CM | POA: Diagnosis not present

## 2020-01-21 DIAGNOSIS — N182 Chronic kidney disease, stage 2 (mild): Secondary | ICD-10-CM | POA: Diagnosis not present

## 2020-01-21 DIAGNOSIS — I10 Essential (primary) hypertension: Secondary | ICD-10-CM | POA: Diagnosis not present

## 2020-01-21 DIAGNOSIS — E1122 Type 2 diabetes mellitus with diabetic chronic kidney disease: Secondary | ICD-10-CM | POA: Diagnosis not present

## 2020-03-15 DIAGNOSIS — F4320 Adjustment disorder, unspecified: Secondary | ICD-10-CM | POA: Diagnosis not present

## 2020-03-16 DIAGNOSIS — E1122 Type 2 diabetes mellitus with diabetic chronic kidney disease: Secondary | ICD-10-CM | POA: Diagnosis not present

## 2020-03-16 DIAGNOSIS — Z794 Long term (current) use of insulin: Secondary | ICD-10-CM | POA: Diagnosis not present

## 2020-03-16 DIAGNOSIS — I10 Essential (primary) hypertension: Secondary | ICD-10-CM | POA: Diagnosis not present

## 2020-03-16 DIAGNOSIS — R361 Hematospermia: Secondary | ICD-10-CM | POA: Diagnosis not present

## 2020-04-03 DIAGNOSIS — F4320 Adjustment disorder, unspecified: Secondary | ICD-10-CM | POA: Diagnosis not present

## 2020-04-26 DIAGNOSIS — R109 Unspecified abdominal pain: Secondary | ICD-10-CM | POA: Diagnosis not present

## 2020-04-26 DIAGNOSIS — R1012 Left upper quadrant pain: Secondary | ICD-10-CM | POA: Diagnosis not present

## 2020-04-26 DIAGNOSIS — R140 Abdominal distension (gaseous): Secondary | ICD-10-CM | POA: Diagnosis not present

## 2020-06-02 DIAGNOSIS — K760 Fatty (change of) liver, not elsewhere classified: Secondary | ICD-10-CM | POA: Diagnosis not present

## 2020-06-02 DIAGNOSIS — R1012 Left upper quadrant pain: Secondary | ICD-10-CM | POA: Diagnosis not present

## 2020-07-17 DIAGNOSIS — F4320 Adjustment disorder, unspecified: Secondary | ICD-10-CM | POA: Diagnosis not present

## 2020-09-04 ENCOUNTER — Encounter: Payer: Self-pay | Admitting: Emergency Medicine

## 2020-09-05 ENCOUNTER — Ambulatory Visit: Payer: Self-pay | Attending: Internal Medicine

## 2020-09-05 DIAGNOSIS — Z23 Encounter for immunization: Secondary | ICD-10-CM

## 2020-09-05 NOTE — Progress Notes (Signed)
   Covid-19 Vaccination Clinic  Name:  Ricky Moore    MRN: [id number] DOB: 03-31-1976  09/05/2020  [name] was observed post Covid-19 immunization for 15 minutes without incident. He was provided with Vaccine Information Sheet and instruction to access the V-Safe system.   [name] was instructed to call 911 with any severe reactions post vaccine: [name] Difficulty breathing  . Swelling of face and throat  . A fast heartbeat  . A bad rash all over body  . Dizziness and weakness   Immunizations Administered    Name Date Dose VIS Date Route   Pfizer COVID-19 Vaccine 09/05/2020  1:38 PM 0.3 mL 06/21/2020 Intramuscular   Manufacturer: [employer], [employer]   Lot: [id number]   NDC: [address]-1

## 2020-11-15 ENCOUNTER — Encounter: Payer: Self-pay | Admitting: Radiology

## 2020-11-15 ENCOUNTER — Emergency Department: Payer: BC Managed Care – PPO

## 2020-11-15 ENCOUNTER — Observation Stay
Admission: EM | Admit: 2020-11-15 | Discharge: 2020-11-16 | Disposition: A | Discharge status: Home / Self Care | Payer: BC Managed Care – PPO | Attending: Internal Medicine | Admitting: Internal Medicine

## 2020-11-15 ENCOUNTER — Other Ambulatory Visit: Payer: Self-pay

## 2020-11-15 DIAGNOSIS — G4733 Obstructive sleep apnea (adult) (pediatric): Secondary | ICD-10-CM | POA: Diagnosis not present

## 2020-11-15 DIAGNOSIS — Z79899 Other long term (current) drug therapy: Secondary | ICD-10-CM | POA: Insufficient documentation

## 2020-11-15 DIAGNOSIS — E1142 Type 2 diabetes mellitus with diabetic polyneuropathy: Secondary | ICD-10-CM

## 2020-11-15 DIAGNOSIS — Z6841 Body Mass Index (BMI) 40.0 and over, adult: Secondary | ICD-10-CM | POA: Diagnosis not present

## 2020-11-15 DIAGNOSIS — N1831 Chronic kidney disease, stage 3a: Secondary | ICD-10-CM | POA: Diagnosis not present

## 2020-11-15 DIAGNOSIS — Z20822 Contact with and (suspected) exposure to covid-19: Secondary | ICD-10-CM | POA: Insufficient documentation

## 2020-11-15 DIAGNOSIS — I129 Hypertensive chronic kidney disease with stage 1 through stage 4 chronic kidney disease, or unspecified chronic kidney disease: Secondary | ICD-10-CM | POA: Diagnosis not present

## 2020-11-15 DIAGNOSIS — I25110 Atherosclerotic heart disease of native coronary artery with unstable angina pectoris: Secondary | ICD-10-CM

## 2020-11-15 DIAGNOSIS — I10 Essential (primary) hypertension: Secondary | ICD-10-CM | POA: Diagnosis not present

## 2020-11-15 DIAGNOSIS — R7989 Other specified abnormal findings of blood chemistry: Secondary | ICD-10-CM | POA: Diagnosis not present

## 2020-11-15 DIAGNOSIS — Z9989 Dependence on other enabling machines and devices: Secondary | ICD-10-CM | POA: Diagnosis not present

## 2020-11-15 DIAGNOSIS — I214 Non-ST elevation (NSTEMI) myocardial infarction: Secondary | ICD-10-CM | POA: Diagnosis not present

## 2020-11-15 DIAGNOSIS — I161 Hypertensive emergency: Secondary | ICD-10-CM | POA: Diagnosis not present

## 2020-11-15 DIAGNOSIS — R778 Other specified abnormalities of plasma proteins: Secondary | ICD-10-CM | POA: Diagnosis not present

## 2020-11-15 DIAGNOSIS — R079 Chest pain, unspecified: Secondary | ICD-10-CM | POA: Diagnosis not present

## 2020-11-15 DIAGNOSIS — Z7984 Long term (current) use of oral hypoglycemic drugs: Secondary | ICD-10-CM | POA: Insufficient documentation

## 2020-11-15 DIAGNOSIS — I160 Hypertensive urgency: Secondary | ICD-10-CM | POA: Diagnosis not present

## 2020-11-15 DIAGNOSIS — I708 Atherosclerosis of other arteries: Secondary | ICD-10-CM | POA: Diagnosis not present

## 2020-11-15 DIAGNOSIS — E1122 Type 2 diabetes mellitus with diabetic chronic kidney disease: Secondary | ICD-10-CM | POA: Diagnosis not present

## 2020-11-15 DIAGNOSIS — I2510 Atherosclerotic heart disease of native coronary artery without angina pectoris: Secondary | ICD-10-CM | POA: Diagnosis not present

## 2020-11-15 DIAGNOSIS — K760 Fatty (change of) liver, not elsewhere classified: Secondary | ICD-10-CM | POA: Diagnosis not present

## 2020-11-15 DIAGNOSIS — Z794 Long term (current) use of insulin: Secondary | ICD-10-CM

## 2020-11-15 DIAGNOSIS — R0789 Other chest pain: Secondary | ICD-10-CM | POA: Diagnosis not present

## 2020-11-15 LAB — BASIC METABOLIC PANEL
Anion gap: 9 (ref 5–15)
BUN: 18 mg/dL (ref 6–20)
CO2: 25 mmol/L (ref 22–32)
Calcium: 9.5 mg/dL (ref 8.9–10.3)
Chloride: 106 mmol/L (ref 98–111)
Creatinine, Ser: 1.56 mg/dL — ABNORMAL HIGH (ref 0.61–1.24)
GFR, Estimated: 55 mL/min — ABNORMAL LOW (ref >60)
Glucose, Bld: 93 mg/dL (ref 70–99)
Potassium: 3.9 mmol/L (ref 3.5–5.1)
Sodium: 140 mmol/L (ref 135–145)

## 2020-11-15 LAB — CBC
HCT: 48.6 % (ref 39.0–52.0)
Hemoglobin: 17.5 g/dL — ABNORMAL HIGH (ref 13.0–17.0)
MCH: 27 pg (ref 26.0–34.0)
MCHC: 36 g/dL (ref 30.0–36.0)
MCV: 75.1 fL — ABNORMAL LOW (ref 80.0–100.0)
Platelets: 312 10*3/uL (ref 150–400)
RBC: 6.47 MIL/uL — ABNORMAL HIGH (ref 4.22–5.81)
RDW: 15.9 % — ABNORMAL HIGH (ref 11.5–15.5)
WBC: 7.1 10*3/uL (ref 4.0–10.5)
nRBC: 0 % (ref 0.0–0.2)

## 2020-11-15 LAB — RESP PANEL BY RT-PCR (FLU A&B, COVID) ARPGX2
Influenza A by PCR: NEGATIVE
Influenza B by PCR: NEGATIVE
SARS Coronavirus 2 by RT PCR: NEGATIVE

## 2020-11-15 LAB — LIPID PANEL
Cholesterol: 223 mg/dL — ABNORMAL HIGH (ref 0–200)
HDL: 46 mg/dL (ref >40)
LDL Cholesterol: 154 mg/dL — ABNORMAL HIGH (ref 0–99)
Total CHOL/HDL Ratio: 4.8 RATIO
Triglycerides: 116 mg/dL (ref <150)
VLDL: 23 mg/dL (ref 0–40)

## 2020-11-15 LAB — GLUCOSE, CAPILLARY: Glucose-Capillary: 93 mg/dL (ref 70–99)

## 2020-11-15 LAB — HEPATIC FUNCTION PANEL
ALT: 21 U/L (ref 0–44)
AST: 23 U/L (ref 15–41)
Albumin: 4.2 g/dL (ref 3.5–5.0)
Alkaline Phosphatase: 75 U/L (ref 38–126)
Bilirubin, Direct: <0.1 mg/dL (ref 0.0–0.2)
Total Bilirubin: 0.9 mg/dL (ref 0.3–1.2)
Total Protein: 7.5 g/dL (ref 6.5–8.1)

## 2020-11-15 LAB — TROPONIN I (HIGH SENSITIVITY)
Troponin I (High Sensitivity): 65 ng/L — ABNORMAL HIGH (ref <18)
Troponin I (High Sensitivity): 66 ng/L — ABNORMAL HIGH (ref <18)
Troponin I (High Sensitivity): 66 ng/L — ABNORMAL HIGH (ref <18)

## 2020-11-15 LAB — LIPASE, BLOOD: Lipase: 35 U/L (ref 11–51)

## 2020-11-15 MED ORDER — INSULIN ASPART 100 UNIT/ML ~~LOC~~ SOLN: 0.0000 [IU] | Freq: Every day | SUBCUTANEOUS | Status: DC

## 2020-11-15 MED ORDER — METOPROLOL TARTRATE 50 MG PO TABS
50.0000 mg | ORAL_TABLET | Freq: Two times a day (BID) | ORAL | Status: DC
  Administered 2020-11-15: 50 mg via ORAL
  Filled 2020-11-15: qty 1

## 2020-11-15 MED ORDER — ACETAMINOPHEN 325 MG PO TABS: 650.0000 mg | ORAL_TABLET | ORAL | Status: DC | PRN

## 2020-11-15 MED ORDER — NITROGLYCERIN 0.4 MG SL SUBL: 0.4000 mg | SUBLINGUAL_TABLET | SUBLINGUAL | Status: DC | PRN

## 2020-11-15 MED ORDER — INSULIN ASPART 100 UNIT/ML ~~LOC~~ SOLN: 0.0000 [IU] | Freq: Three times a day (TID) | SUBCUTANEOUS | Status: DC

## 2020-11-15 MED ORDER — LOSARTAN POTASSIUM 50 MG PO TABS
50.0000 mg | ORAL_TABLET | Freq: Two times a day (BID) | ORAL | Status: DC
  Administered 2020-11-15 – 2020-11-16 (×2): 50 mg via ORAL
  Filled 2020-11-15 (×2): qty 1

## 2020-11-15 MED ORDER — ATORVASTATIN CALCIUM 20 MG PO TABS
40.0000 mg | ORAL_TABLET | Freq: Every day | ORAL | Status: DC
  Administered 2020-11-16: 40 mg via ORAL
  Filled 2020-11-15: qty 2

## 2020-11-15 MED ORDER — HYDROCHLOROTHIAZIDE 25 MG PO TABS: 25.0000 mg | ORAL_TABLET | Freq: Every morning | ORAL | Status: DC

## 2020-11-15 MED ORDER — LABETALOL HCL 5 MG/ML IV SOLN
20.0000 mg | Freq: Once | INTRAVENOUS | Status: AC
  Administered 2020-11-15: 20 mg via INTRAVENOUS
  Filled 2020-11-15: qty 4

## 2020-11-15 MED ORDER — HEPARIN BOLUS VIA INFUSION
4000.0000 [IU] | Freq: Once | INTRAVENOUS | Status: AC
  Administered 2020-11-15: 4000 [IU] via INTRAVENOUS
  Filled 2020-11-15: qty 4000

## 2020-11-15 MED ORDER — ASPIRIN EC 81 MG PO TBEC
81.0000 mg | DELAYED_RELEASE_TABLET | Freq: Every day | ORAL | Status: DC
  Administered 2020-11-16: 81 mg via ORAL
  Filled 2020-11-15: qty 1

## 2020-11-15 MED ORDER — ONDANSETRON HCL 4 MG/2ML IJ SOLN: 4.0000 mg | Freq: Four times a day (QID) | INTRAMUSCULAR | Status: DC | PRN

## 2020-11-15 MED ORDER — AMLODIPINE BESYLATE 10 MG PO TABS
10.0000 mg | ORAL_TABLET | Freq: Every day | ORAL | Status: DC
  Administered 2020-11-15 – 2020-11-16 (×2): 10 mg via ORAL
  Filled 2020-11-15 (×2): qty 1

## 2020-11-15 MED ORDER — IOHEXOL 350 MG/ML SOLN
125.0000 mL | Freq: Once | INTRAVENOUS | Status: AC | PRN
  Administered 2020-11-15: 125 mL via INTRAVENOUS

## 2020-11-15 MED ORDER — ASPIRIN 81 MG PO CHEW
324.0000 mg | CHEWABLE_TABLET | Freq: Once | ORAL | Status: AC
  Administered 2020-11-15: 324 mg via ORAL
  Filled 2020-11-15: qty 4

## 2020-11-15 MED ORDER — HEPARIN (PORCINE) 25000 UT/250ML-% IV SOLN
1700.0000 [IU]/h | INTRAVENOUS | Status: DC
  Administered 2020-11-15: 1500 [IU]/h via INTRAVENOUS
  Filled 2020-11-15: qty 250

## 2020-11-15 NOTE — ED Provider Notes (Signed)
[facility] Emergency Department Provider Note  Time seen: 8:15 PM  I have reviewed the triage vital signs and the nursing notes.   HISTORY  Chief Complaint Chest Pain  HPI Ricky Moore is a 45 y.o. male with a past medical history of gastric reflux, hypertension, CKD, diabetes, presents to the emergency department  for chest pain.  According to the patient for the past 1 week he has had a fairly constant sharp pain in his right chest that goes straight through to the back.  States it is somewhat worse with movement.  Denies any cough or shortness of breath.  Denies any leg pain or swelling.  Patient states his blood pressure has been elevated recently, over the past week though he has been taking his medications more consistently per patient.  Blood pressure currently significantly hypertensive 193/122.  Past Medical History:  Diagnosis Date   Bladder tumor    Complication of anesthesia    SLIGHT NAUSEA   Diabetes mellitus without complication (HCC)    GERD (gastroesophageal reflux disease)    RELATED TO CERTAIN FOODS   Hematuria    Hypertension    Sickle cell trait ([place])    Sleep apnea    USES C PAP    Patient Active Problem List   Diagnosis Date Noted   Morbid (severe) obesity due to excess calories ([place]) 03/10/2016   Upper airway cough syndrome 03/08/2016   Acute renal failure ([place]) 07/14/2014   New onset type 2 diabetes mellitus ([place]) 07/14/2014   Mass of bladder 07/14/2014   CKD (chronic kidney disease) 07/14/2014   Essential hypertension, benign 07/14/2014   Hyperglycemia 07/14/2014    Past Surgical History:  Procedure Laterality Date   CYSTOSCOPY WITH RETROGRADE PYELOGRAM, URETEROSCOPY AND STENT PLACEMENT Bilateral 06/14/2015   Procedure: CYSTOSCOPY WITH BIOPSY, FULGERATION,  BILATERAL RETROGRADE PYELOGRAM;  Surgeon: Alexis Frock, MD;  Location: [facility];  Service: Urology;  Laterality: Bilateral;   TONSILLECTOMY  2008    TRANSURETHRAL RESECTION OF BLADDER TUMOR WITH GYRUS (TURBT-GYRUS)  2010   VASECTOMY Bilateral 06/14/2015   Procedure: VASECTOMY;  Surgeon: Alexis Frock, MD;  Location: [facility];  Service: Urology;  Laterality: Bilateral;    Prior to Admission medications   Medication Sig Start Date End Date Taking? Authorizing Provider  acetaminophen-codeine (TYLENOL #3) 300-30 MG tablet One to  two every 4 hours as needed for cough Patient not taking: Reported on 04/25/2016 03/28/16   Tanda Rockers, MD  aspirin-acetaminophen-caffeine (EXCEDRIN MIGRAINE) [id number] MG per tablet Take 1 tablet by mouth every 6 (six) hours as needed for headache.    [provider]  Chlorpheniramine-DM (CORICIDIN HBP COUGH/COLD PO) as directed.    [provider]  diphenhydramine-acetaminophen (TYLENOL PM) 25-500 MG TABS tablet Take 1 tablet by mouth at bedtime as needed.    [provider]  famotidine (PEPCID) 20 MG tablet One at bedtime 03/08/16   Tanda Rockers, MD  hydrochlorothiazide (HYDRODIURIL) 25 MG tablet Take 25 mg by mouth every morning.    [provider]  insulin detemir (LEVEMIR) 100 UNIT/ML injection Inject 30 Units into the skin at bedtime.    [provider]  metoprolol (LOPRESSOR) 50 MG tablet Take 1 tablet by mouth 2 (two) times daily. 04/13/16   [provider]  minoxidil (LONITEN) 10 MG tablet Take 1 tablet (10 mg total) by mouth 2 (two) times daily. 04/01/16   Tanda Rockers, MD  nebivolol (BYSTOLIC) 10 MG tablet Take 10 mg by mouth  daily.    [provider]  PROAIR HFA 108 (90 Base) MCG/ACT inhaler Inhale 1 puff into the lungs every 4 (four) hours as needed. 02/19/16   [provider]  Respiratory Therapy Supplies (FLUTTER) DEVI Use as directed 03/28/16   Tanda Rockers, MD  sitaGLIPtin-metformin (JANUMET) 50-500 MG tablet Take 1 tablet by mouth 2 (two) times daily.    [provider]    Allergies  Allergen Reactions   Other      Medications that end in "pril" cause severe coughing    Family History  Problem Relation Age of Onset   Diabetes Mellitus II Maternal Aunt     Social History Social History   Tobacco Use   Smoking status: Never Smoker   Smokeless tobacco: Never Used  Substance Use Topics   Alcohol use: Yes    Alcohol/week: 0.0 standard drinks    Comment: OCCASIONAL   Drug use: No    Review of Systems Constitutional: Negative for fever. Cardiovascular: Moderate sharp right-sided chest pain that radiates to the back. Respiratory: Negative for shortness of breath. Gastrointestinal: Negative for abdominal pain, vomiting  Musculoskeletal: Negative for musculoskeletal complaints Neurological: Negative for headache All other ROS negative  ____________________________________________   PHYSICAL EXAM:  VITAL SIGNS: ED Triage Vitals  Enc Vitals Group     BP 11/15/20 1708 123/88     Pulse Rate 11/15/20 1708 94     Resp 11/15/20 1708 16     Temp 11/15/20 1708 98.7 F (37.1 C)     Temp Source 11/15/20 1708 Oral     SpO2 11/15/20 1708 96 %     Weight 11/15/20 1705 (!) 325 lb (147.4 kg)     Height 11/15/20 1705 6\' 3"  (1.905 m)     Head Circumference --      Peak Flow --      Pain Score 11/15/20 1705 4     Pain Loc --      Pain Edu? --      Excl. in [place]? --     Constitutional: Alert and oriented. Well appearing and in no distress. Eyes: Normal exam ENT      Head: Normocephalic and atraumatic.      Mouth/Throat: Mucous membranes are moist. Cardiovascular: Normal rate, regular rhythm.  Respiratory: Normal respiratory effort without tachypnea nor retractions. Breath sounds are clear Gastrointestinal: Soft and nontender. No distention.   Musculoskeletal: Nontender with normal range of motion in all extremities.  Neurologic:  Normal speech and language. No gross focal neurologic deficits  Skin:  Skin is warm, dry and intact.  Psychiatric: Mood and affect are normal.    ____________________________________________    EKG  EKG viewed and interpreted by myself shows a normal sinus rhythm at 91 bpm with a narrow QRS, left axis deviation, largely normal intervals with nonspecific ST changes.  No ST elevation.  ____________________________________________    RADIOLOGY  Chest x-ray is negative CTA is essentially negative for acute abnormality. ____________________________________________   INITIAL IMPRESSION / ASSESSMENT AND PLAN / ED COURSE  Pertinent labs & imaging results that were available during my care of the patient were reviewed by me and considered in my medical decision making (see chart for details).   Patient presents to the emergency department for central sharp chest pain that radiates to his back somewhat more right-sided.  Moderate in severity and constant, somewhat worse with movement.  Patient is lab work today shows an elevated troponin in the mid 60s, unchanged after 2  hours.  Patient is quite hypertensive.  With the description of sharp pain radiating to the back and worsening over the past 1 week I believe the patient is at risk for aortic dissection less likely but possibly pulmonary embolism.  We will proceed with CTA imaging to rule out dissection.  Discussed the plan of care with the patient who is agreeable.  Differential would also include ACS, PE, chest wall pain, pleurisy, pneumonia.  CTA is negative for acute abnormality.  However patient continues to be significantly hypertensive 193/122.  We will dose IV labetalol.  Given the patient's elevated troponin this is concerning for possible hypertensive emergency.  We will continue to treat with IV antihypertensive medication.  We will dose aspirin and admit to the hospitalist service for further work-up and treatment.  Patient agreeable to plan of care.  Ricky Moore was evaluated in Emergency Department on 11/15/2020 for the symptoms described in the history of present illness. He  was evaluated in the context of the global COVID-19 pandemic, which necessitated consideration that the patient might be at risk for infection with the SARS-CoV-2 virus that causes COVID-19. Institutional protocols and algorithms that pertain to the evaluation of patients at risk for COVID-19 are in a state of rapid change based on information released by regulatory bodies including the CDC and federal and state organizations. These policies and algorithms were followed during the patient's care in the ED.  ____________________________________________   FINAL CLINICAL IMPRESSION(S) / ED DIAGNOSES  Chest pain Hypertensive emergency Elevated troponin   Harvest Dark, MD 11/15/20 2129

## 2020-11-15 NOTE — Consult Note (Signed)
ANTICOAGULATION CONSULT NOTE  Pharmacy Consult for Heparin Infusion Indication: chest pain/ACS  Patient Measurements: Heparin Dosing Weight: 118.2 kg  Labs: Recent Labs    11/15/20 1708 11/15/20 1918  HGB 17.5*  --   HCT 48.6  --   PLT 312  --   CREATININE 1.56*  --   TROPONINIHS 65* 66*    Estimated Creatinine Clearance: 91.8 mL/min (A) (by C-G formula based on SCr of 1.56 mg/dL (H)).   Medications:  No anticoagulation prior to admission per my chart review  Assessment: Patient is a 45 y/o M with medical history including HTN, CKD, diabetes, gastric reflux who presented to the ED 3/16 with chest pain. Patient noted to have elevated BP. Trending troponin (65 >> 66). No ST elevation noted on EKG. Pharmacy has been consulted to initiate heparin infusion for ACS.  Baseline CBC within normal limits. Baseline aPTT and PT-INR pending.   Goal of Therapy:  Heparin level 0.3-0.7 units/ml Monitor platelets by anticoagulation protocol: Yes   Plan:  --Heparin 4000 unit IV bolus x 1 followed by continuous infusion at 1500 units/hr --Will check HL 6 hours after initiation of infusion --Daily CBC per protocol while on IV heparin  Benita Gutter 11/15/2020,10:14 PM

## 2020-11-15 NOTE — ED Triage Notes (Signed)
Pt arrives from [facility] with chest pain, states that he has been having chest pain that radiates into his back for over a week, states that it is a tight squeezing lump sensation

## 2020-11-15 NOTE — ED Notes (Signed)
Pt reports tightness in his chest today.  No sob.  States dizzy at times.  No n/v/d.  Nonsmoker.  Hx htn.  Pt took bp meds today.  nsr on monitor.  Pt alert   Speech clear.

## 2020-11-15 NOTE — ED Notes (Signed)
meds given

## 2020-11-15 NOTE — ED Triage Notes (Signed)
Pt comes from [facility] with c/o CP and HTN. Pt on BP medications and takes them as prescribed.

## 2020-11-15 NOTE — H&P (Signed)
History and Physical    Ricky Moore MHD:[id number] DOB: August 09, 1976 DOA: 11/15/2020  PCP: Elisabeth Cara, PA-C   Patient coming from: Home  I have personally briefly reviewed patient's old medical records in [place]  Chief Complaint: Chest pain  HPI: Ricky Moore is a 45 y.o. male with medical history significant for diabetes, CKD IIIa, HTN, OSA, morbid obesity who presents to the emergency room with a 1 week history of retrosternal pressure-like chest pain constant but of waxing and waning intensity, radiating to the interscapular region,, worse with exertion and movement.  Has had associated nausea and in the past 2 days lightheadedness.  Denies shortness of breath, palpitations,vomiting or diaphoresis.  Has no cough fever or chills or recent respiratory tract infection.  He denies lower extremity pain or swelling.  Denies abdominal pain or change in bowel habits.   ED Course: On arrival in the emergency room blood pressure was initially normal however since arrival his diastolics have been over 211 with maximum BP 193/122.  Vitals otherwise within normal limits.  Blood work with elevated troponin but with flat trend 65>66.  Creatinine 1.56 which is baseline. EKG as reviewed by me : Normal sinus rhythm at 91.  T wave inversion in V3 but mostly nonspecific ST-T wave changes Imaging: Chest x-ray with no acute process CT angio of chest: No evidence of thoracoabdominal aortic aneurysm or dissection. 2. Moderate coronary artery atherosclerosis greatest in the right coronary and LAD distributions. 3. No acute intrathoracic, intra-abdominal, or intrapelvic process. 4. Hepatic steatosis.  Patient was treated with chewable ASA 324 with labetalol in the emergency room.  Hospitalist consulted for admission.  Review of Systems: As per HPI otherwise all other systems on review of systems negative.    Past Medical History:  Diagnosis Date  . Bladder tumor   . Complication of  anesthesia    SLIGHT NAUSEA  . Diabetes mellitus without complication ([place])   . GERD (gastroesophageal reflux disease)    RELATED TO CERTAIN FOODS  . Hematuria   . Hypertension   . Sickle cell trait ([place])   . Sleep apnea    USES C PAP    Past Surgical History:  Procedure Laterality Date  . CYSTOSCOPY WITH RETROGRADE PYELOGRAM, URETEROSCOPY AND STENT PLACEMENT Bilateral 06/14/2015   Procedure: CYSTOSCOPY WITH BIOPSY, FULGERATION,  BILATERAL RETROGRADE PYELOGRAM;  Surgeon: Alexis Frock, MD;  Location: [facility];  Service: Urology;  Laterality: Bilateral;  . TONSILLECTOMY  2008  . TRANSURETHRAL RESECTION OF BLADDER TUMOR WITH GYRUS (TURBT-GYRUS)  2010  . VASECTOMY Bilateral 06/14/2015   Procedure: VASECTOMY;  Surgeon: Alexis Frock, MD;  Location: [facility];  Service: Urology;  Laterality: Bilateral;     reports that he has never smoked. He has never used smokeless tobacco. He reports current alcohol use. He reports that he does not use drugs.  Allergies  Allergen Reactions  . Lisinopril Other (See Comments)    ALL Medications that end in "PRIL" cause severe coughing  . Molds & Smuts Swelling    ITCHY & BURNING EYES ITCHY & BURNING EYES   . Other Anaphylaxis    Medications that end in "pril" cause severe coughing    Family History  Problem Relation Age of Onset  . Diabetes Mellitus II Maternal Aunt       Prior to Admission medications   Medication Sig Start Date End Date Taking? Authorizing Provider  amLODipine (NORVASC) 10 MG tablet Take 10 mg by mouth daily. 10/18/20  Yes [provider]  atorvastatin (LIPITOR) 10 MG tablet Take 10 mg by mouth daily.   Yes [provider]  gabapentin (NEURONTIN) 300 MG capsule Take 300 mg by mouth at bedtime.   Yes [provider]  hydrochlorothiazide (HYDRODIURIL) 25 MG tablet Take 25 mg by mouth every morning.   Yes [provider]  insulin detemir (LEVEMIR) 100 UNIT/ML injection Inject 30 Units into  the skin at bedtime.   Yes [provider]  JARDIANCE 25 MG TABS tablet Take 25 mg by mouth daily. 10/18/20  Yes [provider]  losartan (COZAAR) 50 MG tablet Take 50 mg by mouth 2 (two) times daily.   Yes [provider]  TRULICITY 3 KC/1.2XN SOPN Inject 3 mg into the skin once a week. 08/17/20  Yes [provider]  acetaminophen-codeine (TYLENOL #3) 300-30 MG tablet One to  two every 4 hours as needed for cough Patient taking differently: One to  two every 4 hours as needed for cough 03/28/16   Tanda Rockers, MD  minoxidil (LONITEN) 10 MG tablet Take 1 tablet (10 mg total) by mouth 2 (two) times daily. 04/01/16   Tanda Rockers, MD  Respiratory Therapy Supplies (FLUTTER) DEVI Use as directed 03/28/16   Tanda Rockers, MD    Physical Exam: Vitals:   11/15/20 2140 11/15/20 2145 11/15/20 2150 11/15/20 2155  BP: (!) 172/104     Pulse: 78 80 83 82  Resp: (!) 25 (!) 24 15 (!) 23  Temp:      TempSrc:      SpO2: 99% 98% 99% 99%  Weight:      Height:         Vitals:   11/15/20 2140 11/15/20 2145 11/15/20 2150 11/15/20 2155  BP: (!) 172/104     Pulse: 78 80 83 82  Resp: (!) 25 (!) 24 15 (!) 23  Temp:      TempSrc:      SpO2: 99% 98% 99% 99%  Weight:      Height:          Constitutional: Alert and oriented x 3 . Not in any apparent distress HEENT:      Head: Normocephalic and atraumatic.         Eyes: PERLA, EOMI, Conjunctivae are normal. Sclera is non-icteric.       Mouth/Throat: Mucous membranes are moist.       Neck: Supple with no signs of meningismus. Cardiovascular: Regular rate and rhythm. No murmurs, gallops, or rubs. 2+ symmetrical distal pulses are present . No JVD. No LE edema Respiratory: Respiratory effort normal .Lungs sounds clear bilaterally. No wheezes, crackles, or rhonchi.  Gastrointestinal: Soft, non tender, and non distended with positive bowel sounds.  Genitourinary: No CVA tenderness. Musculoskeletal: Nontender with  normal range of motion in all extremities. No cyanosis, or erythema of extremities. Neurologic:  Face is symmetric. Moving all extremities. No gross focal neurologic deficits . Skin: Skin is warm, dry.  No rash or ulcers Psychiatric: Mood and affect are normal    Labs on Admission: I have personally reviewed following labs and imaging studies  CBC: Recent Labs  Lab 11/15/20 1708  WBC 7.1  HGB 17.5*  HCT 48.6  MCV 75.1*  PLT 170   Basic Metabolic Panel: Recent Labs  Lab 11/15/20 1708  NA 140  K 3.9  CL 106  CO2 25  GLUCOSE 93  BUN 18  CREATININE 1.56*  CALCIUM 9.5   GFR: Estimated Creatinine Clearance:  91.8 mL/min (A) (by C-G formula based on SCr of 1.56 mg/dL (H)). Liver Function Tests: Recent Labs  Lab 11/15/20 1918  AST 23  ALT 21  ALKPHOS 75  BILITOT 0.9  PROT 7.5  ALBUMIN 4.2   Recent Labs  Lab 11/15/20 1918  LIPASE 35   No results for input(s): AMMONIA in the last 168 hours. Coagulation Profile: No results for input(s): INR, PROTIME in the last 168 hours. Cardiac Enzymes: No results for input(s): CKTOTAL, CKMB, CKMBINDEX, TROPONINI in the last 168 hours. BNP (last 3 results) No results for input(s): PROBNP in the last 8760 hours. HbA1C: No results for input(s): HGBA1C in the last 72 hours. CBG: No results for input(s): GLUCAP in the last 168 hours. Lipid Profile: No results for input(s): CHOL, HDL, LDLCALC, TRIG, CHOLHDL, LDLDIRECT in the last 72 hours. Thyroid Function Tests: No results for input(s): TSH, T4TOTAL, FREET4, T3FREE, THYROIDAB in the last 72 hours. Anemia Panel: No results for input(s): VITAMINB12, FOLATE, FERRITIN, TIBC, IRON, RETICCTPCT in the last 72 hours. Urine analysis:    Component Value Date/Time   COLORURINE YELLOW (A) 02/10/2018 1115   APPEARANCEUR CLEAR (A) 02/10/2018 1115   LABSPEC 1.037 (H) 02/10/2018 1115   PHURINE 5.0 02/10/2018 1115   GLUCOSEU >=500 (A) 02/10/2018 1115   HGBUR NEGATIVE 02/10/2018 1115    BILIRUBINUR NEGATIVE 02/10/2018 1115   KETONESUR 5 (A) 02/10/2018 1115   PROTEINUR NEGATIVE 02/10/2018 1115   UROBILINOGEN 0.2 07/14/2014 1125   NITRITE NEGATIVE 02/10/2018 1115   LEUKOCYTESUR NEGATIVE 02/10/2018 1115    Radiological Exams on Admission: DG Chest 2 View  Result Date: 11/15/2020 CLINICAL DATA:  Chest pain. EXAM: CHEST - 2 VIEW COMPARISON:  03/08/2016 FINDINGS: Chronic elevation of right hemidiaphragm. Stable heart size and mediastinal contours. No focal airspace disease. No pleural effusion or pneumothorax. No pulmonary edema. No acute osseous abnormalities are seen. IMPRESSION: No acute chest findings. Electronically Signed   By: Keith Rake M.D.   On: 11/15/2020 17:42   CT Angio Chest/Abd/Pel for Dissection W and/or Wo Contrast  Result Date: 11/15/2020 CLINICAL DATA:  Chest pain radiating to back for 1 week EXAM: CT ANGIOGRAPHY CHEST, ABDOMEN AND PELVIS TECHNIQUE: Non-contrast CT of the chest was initially obtained. Multidetector CT imaging through the chest, abdomen and pelvis was performed using the standard protocol during bolus administration of intravenous contrast. Multiplanar reconstructed images and MIPs were obtained and reviewed to evaluate the vascular anatomy. CONTRAST:  111mL OMNIPAQUE IOHEXOL 350 MG/ML SOLN COMPARISON:  11/15/2020 FINDINGS: CTA CHEST FINDINGS Cardiovascular: Thoracic aorta is unremarkable with no aneurysm or dissection. Bovine arch incidentally noted. Mild left ventricular dilatation without frank cardiomegaly. No pericardial effusion. Coronary artery atherosclerosis most pronounced within the right coronary and LAD distribution. Mediastinum/Nodes: No enlarged mediastinal, hilar, or axillary lymph nodes. Thyroid gland, trachea, and esophagus demonstrate no significant findings. Lungs/Pleura: No airspace disease, effusion, or pneumothorax. Central airways are patent. Musculoskeletal: No acute or destructive bony lesions. Reconstructed images  demonstrate no additional findings. Review of the MIP images confirms the above findings. CTA ABDOMEN AND PELVIS FINDINGS VASCULAR Aorta: Normal caliber aorta without aneurysm, dissection, vasculitis or significant stenosis. Celiac: Patent without evidence of aneurysm, dissection, vasculitis or significant stenosis. SMA: Patent without evidence of aneurysm, dissection, vasculitis or significant stenosis. Renals: Both renal arteries are patent without evidence of aneurysm, dissection, vasculitis, fibromuscular dysplasia or significant stenosis. IMA: Patent without evidence of aneurysm, dissection, vasculitis or significant stenosis. Inflow: Patent without evidence of aneurysm, dissection, vasculitis or significant stenosis. Mild  atherosclerosis of the bilateral internal iliac arteries. Veins: No obvious venous abnormality within the limitations of this arterial phase study. Review of the MIP images confirms the above findings. NON-VASCULAR Hepatobiliary: Diffuse hepatic steatosis without focal abnormality. No evidence of cholelithiasis or cholecystitis. No biliary dilation. Pancreas: Unremarkable. No pancreatic ductal dilatation or surrounding inflammatory changes. Spleen: Normal in size without focal abnormality. Adrenals/Urinary Tract: Adrenal glands are unremarkable. Kidneys are normal, without renal calculi, focal lesion, or hydronephrosis. Bladder is unremarkable. Stomach/Bowel: No bowel obstruction or ileus. Normal appendix right lower quadrant. No bowel wall thickening or inflammatory change. Lymphatic: No pathologic adenopathy within the abdomen or pelvis. Reproductive: Prostate is unremarkable. Other: No free fluid or free gas.  No abdominal wall hernia. Musculoskeletal: No acute or destructive bony lesions. Reconstructed images demonstrate no additional findings. Review of the MIP images confirms the above findings. IMPRESSION: 1. No evidence of thoracoabdominal aortic aneurysm or dissection. 2. Moderate  coronary artery atherosclerosis greatest in the right coronary and LAD distributions. 3. No acute intrathoracic, intra-abdominal, or intrapelvic process. 4. Hepatic steatosis. Electronically Signed   By: Randa Ngo M.D.   On: 11/15/2020 20:53     Assessment/Plan 45 year old male with history of diabetes, CKD IIIa, HTN, OSA, morbid obesity presenting with 1 week of intermittent chest pain with both typical and atypical features.  Suspect NSTEMI (non-ST elevated myocardial infarction) ([place]) Coronary artery atherosclerosis on CT -Patient with multiple risk factors for ACS presenting with 1 week of intermittent chest pain/tightness, with elevated troponin to mid 60s with flat trend, nonspecific ST-T wave changes on EKG and with CTA findings showing coronary atherosclerosis in RCA and LAD distributions -CTA chest ruled out thoracic and abdominal aneurysm now dissection and ruled out acute PE -We will treat as NSTEMI with heparin infusion -Beta-blocker, statin and antiplatelets -Echocardiogram to evaluate for focal wall motion abnormality -We will again trend troponin -Cardiology consult -Keep n.p.o. in case of procedure in the a.m.    Hypertensive urgency -BP elevated in the ER to 193/120, 176/124 -Resume home antihypertensives and continue to monitor    Type 2 diabetes mellitus with stage 3 chronic kidney disease (HCC) -Sliding scale insulin coverage -A1c    Obesity, Class III, BMI 40-49.9 (morbid obesity) ([place]) -Complicating factor to overall prognosis and care    Chronic kidney disease, stage 3a ([place]) -Renal function at baseline    OSA on CPAP -CPAP if desired    DVT prophylaxis: Full dose heparin Code Status: full code  Family Communication:  none  Disposition Plan: Back to previous home environment Consults called: Cardiology Status: Observation    Athena Masse MD [facility]     11/15/2020, 10:10 PM

## 2020-11-16 ENCOUNTER — Observation Stay (HOSPITAL_BASED_OUTPATIENT_CLINIC_OR_DEPARTMENT_OTHER)
Admit: 2020-11-16 | Discharge: 2020-11-16 | Disposition: A | Discharge status: Home / Self Care | Payer: BC Managed Care – PPO | Attending: Internal Medicine | Admitting: Internal Medicine

## 2020-11-16 DIAGNOSIS — R778 Other specified abnormalities of plasma proteins: Secondary | ICD-10-CM | POA: Diagnosis not present

## 2020-11-16 DIAGNOSIS — N183 Chronic kidney disease, stage 3 unspecified: Secondary | ICD-10-CM | POA: Diagnosis not present

## 2020-11-16 DIAGNOSIS — I10 Essential (primary) hypertension: Secondary | ICD-10-CM

## 2020-11-16 DIAGNOSIS — I214 Non-ST elevation (NSTEMI) myocardial infarction: Secondary | ICD-10-CM | POA: Diagnosis not present

## 2020-11-16 DIAGNOSIS — I2510 Atherosclerotic heart disease of native coronary artery without angina pectoris: Secondary | ICD-10-CM | POA: Diagnosis not present

## 2020-11-16 DIAGNOSIS — R7989 Other specified abnormal findings of blood chemistry: Secondary | ICD-10-CM

## 2020-11-16 DIAGNOSIS — I429 Cardiomyopathy, unspecified: Secondary | ICD-10-CM | POA: Diagnosis not present

## 2020-11-16 DIAGNOSIS — N1831 Chronic kidney disease, stage 3a: Secondary | ICD-10-CM

## 2020-11-16 LAB — ECHOCARDIOGRAM COMPLETE
AR max vel: 2.58 cm2
AV Area VTI: 2.3 cm2
AV Area mean vel: 2.37 cm2
AV Mean grad: 5 mmHg
AV Peak grad: 7.4 mmHg
Ao pk vel: 1.36 m/s
Area-P 1/2: 5.13 cm2
Calc EF: 20.9 %
Height: 75 in
MV VTI: 2.33 cm2
S' Lateral: 4.9 cm
Single Plane A2C EF: 21.8 %
Single Plane A4C EF: 19.7 %
Weight: 5148.8 oz

## 2020-11-16 LAB — HIV ANTIBODY (ROUTINE TESTING W REFLEX): HIV Screen 4th Generation wRfx: NONREACTIVE

## 2020-11-16 LAB — URINE DRUG SCREEN, QUALITATIVE (ARMC ONLY)
Amphetamines, Ur Screen: NOT DETECTED
Barbiturates, Ur Screen: NOT DETECTED
Benzodiazepine, Ur Scrn: NOT DETECTED
Cannabinoid 50 Ng, Ur ~~LOC~~: NOT DETECTED
Cocaine Metabolite,Ur ~~LOC~~: NOT DETECTED
MDMA (Ecstasy)Ur Screen: NOT DETECTED
Methadone Scn, Ur: NOT DETECTED
Opiate, Ur Screen: NOT DETECTED
Phencyclidine (PCP) Ur S: NOT DETECTED
Tricyclic, Ur Screen: NOT DETECTED

## 2020-11-16 LAB — CBC
HCT: 44.5 % (ref 39.0–52.0)
Hemoglobin: 16.3 g/dL (ref 13.0–17.0)
MCH: 27.5 pg (ref 26.0–34.0)
MCHC: 36.6 g/dL — ABNORMAL HIGH (ref 30.0–36.0)
MCV: 75.2 fL — ABNORMAL LOW (ref 80.0–100.0)
Platelets: 264 10*3/uL (ref 150–400)
RBC: 5.92 MIL/uL — ABNORMAL HIGH (ref 4.22–5.81)
RDW: 15.1 % (ref 11.5–15.5)
WBC: 7.1 10*3/uL (ref 4.0–10.5)
nRBC: 0 % (ref 0.0–0.2)

## 2020-11-16 LAB — HEMOGLOBIN A1C
Hgb A1c MFr Bld: 5.8 % — ABNORMAL HIGH (ref 4.8–5.6)
Mean Plasma Glucose: 119.76 mg/dL

## 2020-11-16 LAB — PROTIME-INR
INR: 1.1 (ref 0.8–1.2)
Prothrombin Time: 13.6 seconds (ref 11.4–15.2)

## 2020-11-16 LAB — CREATININE, SERUM
Creatinine, Ser: 1.52 mg/dL — ABNORMAL HIGH (ref 0.61–1.24)
GFR, Estimated: 57 mL/min — ABNORMAL LOW (ref >60)

## 2020-11-16 LAB — APTT: aPTT: 38 seconds — ABNORMAL HIGH (ref 24–36)

## 2020-11-16 LAB — TROPONIN I (HIGH SENSITIVITY): Troponin I (High Sensitivity): 65 ng/L — ABNORMAL HIGH (ref <18)

## 2020-11-16 LAB — GLUCOSE, CAPILLARY: Glucose-Capillary: 99 mg/dL (ref 70–99)

## 2020-11-16 LAB — HEPARIN LEVEL (UNFRACTIONATED): Heparin Unfractionated: 0.24 IU/mL — ABNORMAL LOW (ref 0.30–0.70)

## 2020-11-16 MED ORDER — ASPIRIN 81 MG PO TBEC: 81.0000 mg | DELAYED_RELEASE_TABLET | Freq: Every day | ORAL | 11 refills | Status: DC

## 2020-11-16 MED ORDER — ATORVASTATIN CALCIUM 40 MG PO TABS: 40.0000 mg | ORAL_TABLET | Freq: Every day | ORAL | 0 refills | Status: DC

## 2020-11-16 MED ORDER — LABETALOL HCL 100 MG PO TABS
200.0000 mg | ORAL_TABLET | Freq: Two times a day (BID) | ORAL | Status: DC
  Administered 2020-11-16: 200 mg via ORAL
  Filled 2020-11-16: qty 2

## 2020-11-16 MED ORDER — IBUPROFEN 600 MG PO TABS: 600.0000 mg | ORAL_TABLET | Freq: Four times a day (QID) | ORAL | 0 refills | Status: DC | PRN

## 2020-11-16 MED ORDER — HEPARIN BOLUS VIA INFUSION
1750.0000 [IU] | Freq: Once | INTRAVENOUS | Status: AC
  Administered 2020-11-16: 1750 [IU] via INTRAVENOUS
  Filled 2020-11-16: qty 1750

## 2020-11-16 MED ORDER — HEPARIN SODIUM (PORCINE) 5000 UNIT/ML IJ SOLN
5000.0000 [IU] | Freq: Three times a day (TID) | INTRAMUSCULAR | Status: DC
  Administered 2020-11-16: 5000 [IU] via SUBCUTANEOUS
  Filled 2020-11-16: qty 1

## 2020-11-16 MED ORDER — LABETALOL HCL 200 MG PO TABS: 200.0000 mg | ORAL_TABLET | Freq: Two times a day (BID) | ORAL | 0 refills | Status: DC

## 2020-11-16 NOTE — Progress Notes (Signed)
*  PRELIMINARY RESULTS* Echocardiogram 2D Echocardiogram has been performed.  Ricky Moore Ricky Moore Ricky Moore Ricky Moore 11/16/2020, 9:37 AM

## 2020-11-16 NOTE — Consult Note (Signed)
Cardiology Consultation:   Patient ID: Ricky Moore; [id number]; [birth year]   Admit date: 11/15/2020 Date of Consult: 11/16/2020  Primary Care Provider: Elisabeth Cara, PA-C Primary Cardiologist: New to [facility] - consult by [facility] Primary Electrophysiologist:  None   Patient Profile:   Ricky Moore is a 45 y.o. male with a hx of DM, CKD stage III, HTN, morbid obesity, and OSA who is being seen today for the evaluation of chest pain with elevated troponin at the request of Dr. Damita Dunnings.  History of Present Illness:   Mr. Gillian has no previously known cardiac history. He was seen at [facility] on 3/16 with a 10 day history of chest pressure that was constant, waxed and waned in intensity, and radiated to his back. Symptoms worse with exertion. He noted some nausea, though otherwise denies associated symptoms. Pain developed after watching a "boring movie" and falling asleep, waking with the discomfort, which has been present since. Around that time, he did put together a large TV stand. Due to symptoms, he was transferred to the ED.   Upon the patient's arrival to [facility] they were found to have an initial BP of 123/88 with subsequent readings in the 716R to 678L systolic, HR 80 to 38B bpm, temp afebrile, oxygen saturation 100% on room air, weight 146 kg. EKG showed sinus rhythm with LVH and nonspecific st/t changes as below, CXR showed no acute findings. CTA of the chest showed no evidence of thoracoabdominal aneurysm with coronary artery calcifications and hepatic steatosis noted. Labs showed HS-Tn 65 with a delta and peak of 66, currently down trending. SCr 1.56 which appears to be his baseline. In the ED he received ASA 324 mg x 1 and was placed on a heparin gtt. He has been continued on PTA medications. Currently, resting comfortably without chest pain.    Past Medical History:  Diagnosis Date  . Bladder tumor   . Complication of anesthesia    SLIGHT NAUSEA  . Diabetes mellitus without  complication ([place])   . GERD (gastroesophageal reflux disease)    RELATED TO CERTAIN FOODS  . Hematuria   . Hypertension   . Sickle cell trait ([place])   . Sleep apnea    USES C PAP    Past Surgical History:  Procedure Laterality Date  . CYSTOSCOPY WITH RETROGRADE PYELOGRAM, URETEROSCOPY AND STENT PLACEMENT Bilateral 06/14/2015   Procedure: CYSTOSCOPY WITH BIOPSY, FULGERATION,  BILATERAL RETROGRADE PYELOGRAM;  Surgeon: Alexis Frock, MD;  Location: [facility];  Service: Urology;  Laterality: Bilateral;  . TONSILLECTOMY  2008  . TRANSURETHRAL RESECTION OF BLADDER TUMOR WITH GYRUS (TURBT-GYRUS)  2010  . VASECTOMY Bilateral 06/14/2015   Procedure: VASECTOMY;  Surgeon: Alexis Frock, MD;  Location: [facility];  Service: Urology;  Laterality: Bilateral;     Home Meds: Prior to Admission medications   Medication Sig Start Date End Date Taking? Authorizing Provider  amLODipine (NORVASC) 10 MG tablet Take 10 mg by mouth daily. 10/18/20  Yes [provider]  atorvastatin (LIPITOR) 10 MG tablet Take 10 mg by mouth daily.   Yes [provider]  gabapentin (NEURONTIN) 300 MG capsule Take 300 mg by mouth at bedtime.   Yes [provider]  hydrochlorothiazide (HYDRODIURIL) 25 MG tablet Take 25 mg by mouth every morning.   Yes [provider]  insulin detemir (LEVEMIR) 100 UNIT/ML injection Inject 30 Units into the skin at bedtime.   Yes [provider]  JARDIANCE 25 MG TABS tablet Take 25 mg by  mouth daily. 10/18/20  Yes [provider]  losartan (COZAAR) 50 MG tablet Take 50 mg by mouth 2 (two) times daily.   Yes [provider]  TRULICITY 3 IE/3.3IR SOPN Inject 3 mg into the skin once a week. 08/17/20  Yes [provider]  acetaminophen-codeine (TYLENOL #3) 300-30 MG tablet One to  two every 4 hours as needed for cough Patient taking differently: One to  two every 4 hours as needed for cough 03/28/16   Tanda Rockers, MD  minoxidil  (LONITEN) 10 MG tablet Take 1 tablet (10 mg total) by mouth 2 (two) times daily. 04/01/16   Tanda Rockers, MD  Respiratory Therapy Supplies (FLUTTER) DEVI Use as directed 03/28/16   Tanda Rockers, MD    Inpatient Medications: Scheduled Meds: . amLODipine  10 mg Oral Daily  . aspirin EC  81 mg Oral Daily  . atorvastatin  40 mg Oral Daily  . hydrochlorothiazide  25 mg Oral q morning  . insulin aspart  0-20 Units Subcutaneous TID WC  . insulin aspart  0-5 Units Subcutaneous QHS  . losartan  50 mg Oral BID  . metoprolol tartrate  50 mg Oral BID   Continuous Infusions: . heparin 1,700 Units/hr (11/16/20 0705)   PRN Meds: acetaminophen, nitroGLYCERIN, ondansetron (ZOFRAN) IV  Allergies:   Allergies  Allergen Reactions  . Lisinopril Other (See Comments)    ALL Medications that end in "PRIL" cause severe coughing  . Molds & Smuts Swelling    ITCHY & BURNING EYES ITCHY & BURNING EYES   . Other Anaphylaxis    Medications that end in "pril" cause severe coughing    Social History:   Social History   Socioeconomic History  . Marital status: Married    Spouse name: Not on file  . Number of children: Not on file  . Years of education: Not on file  . Highest education level: Not on file  Occupational History  . Not on file  Tobacco Use  . Smoking status: Never Smoker  . Smokeless tobacco: Never Used  Substance and Sexual Activity  . Alcohol use: Yes    Alcohol/week: 0.0 standard drinks    Comment: OCCASIONAL  . Drug use: No  . Sexual activity: Not on file  Other Topics Concern  . Not on file  Social History Narrative   ** Merged History Encounter **       Social Determinants of Health   Financial Resource Strain: Not on file  Food Insecurity: Not on file  Transportation Needs: Not on file  Physical Activity: Not on file  Stress: Not on file  Social Connections: Not on file  Intimate Partner Violence: Not on file     Family History:   Family History  Problem  Relation Age of Onset  . Diabetes Mellitus II Maternal Aunt     ROS:  Review of Systems  Constitutional: Negative for chills, diaphoresis, fever, malaise/fatigue and weight loss.  HENT: Negative for congestion.   Eyes: Negative for discharge and redness.  Respiratory: Negative for cough, sputum production, shortness of breath and wheezing.   Cardiovascular: Positive for chest pain. Negative for palpitations, orthopnea, claudication, leg swelling and PND.  Gastrointestinal: Positive for nausea. Negative for abdominal pain, heartburn and vomiting.  Musculoskeletal: Positive for back pain and myalgias. Negative for falls.  Skin: Negative for rash.  Neurological: Negative for dizziness, tingling, tremors, sensory change, speech change, focal weakness, loss of consciousness and weakness.  Endo/Heme/Allergies: Does not  bruise/bleed easily.  Psychiatric/Behavioral: Negative for substance abuse. The patient is not nervous/anxious.   All other systems reviewed and are negative.     Physical Exam/Data:   Vitals:   11/15/20 2246 11/15/20 2256 11/16/20 0514 11/16/20 0730  BP: (!) 162/101  (!) 142/82 (!) 156/103  Pulse: 80  73 73  Resp: 16  18 18   Temp: 98 F (36.7 C)  (!) 97.3 F (36.3 C) (!) 97.5 F (36.4 C)  TempSrc:   Oral Oral  SpO2: 100%  100% 100%  Weight:  (!) 146 kg    Height:  6\' 3"  (1.905 m)      Intake/Output Summary (Last 24 hours) at 11/16/2020 0823 Last data filed at 11/16/2020 0118 Gross per 24 hour  Intake 71.46 ml  Output -  Net 71.46 ml   Filed Weights   11/15/20 1705 11/15/20 2256  Weight: (!) 147.4 kg (!) 146 kg   Body mass index is 40.22 kg/m.   Physical Exam: General: Well developed, well nourished, in no acute distress. Head: Normocephalic, atraumatic, sclera non-icteric, no xanthomas, nares without discharge.  Neck: Negative for carotid bruits. JVD not elevated. Lungs: Clear bilaterally to auscultation without wheezes, rales, or rhonchi. Breathing is  unlabored. Heart: RRR with S1 S2. No murmurs, rubs, or gallops appreciated. Pain is reproducible to palpation on exam.  Abdomen: Soft, non-tender, non-distended with normoactive bowel sounds. No hepatomegaly. No rebound/guarding. No obvious abdominal masses. Msk:  Strength and tone appear normal for age. Mass/knot palpated on Dr. Donivan Scull exam of his back, which reproduced his symptoms.  Extremities: No clubbing or cyanosis. No edema. Distal pedal pulses are 2+ and equal bilaterally. Neuro: Alert and oriented X 3. No facial asymmetry. No focal deficit. Moves all extremities spontaneously. Psych:  Responds to questions appropriately with a normal affect.   EKG:  The EKG was personally reviewed and demonstrates: NSR, 91 bpm, LVH with early repolarization changes, nonspecific st/t changes Telemetry:  Telemetry was personally reviewed and demonstrates: SR with rare PVC  Weights: Filed Weights   11/15/20 1705 11/15/20 2256  Weight: (!) 147.4 kg (!) 146 kg    Relevant CV Studies:  2D echo pending  Laboratory Data:  Chemistry Recent Labs  Lab 11/15/20 1708  NA 140  K 3.9  CL 106  CO2 25  GLUCOSE 93  BUN 18  CREATININE 1.56*  CALCIUM 9.5  GFRNONAA 55*  ANIONGAP 9    Recent Labs  Lab 11/15/20 1918  PROT 7.5  ALBUMIN 4.2  AST 23  ALT 21  ALKPHOS 75  BILITOT 0.9   Hematology Recent Labs  Lab 11/15/20 1708 11/16/20 0500  WBC 7.1 7.1  RBC 6.47* 5.92*  HGB 17.5* 16.3  HCT 48.6 44.5  MCV 75.1* 75.2*  MCH 27.0 27.5  MCHC 36.0 36.6*  RDW 15.9* 15.1  PLT 312 264   Cardiac EnzymesNo results for input(s): TROPONINI in the last 168 hours. No results for input(s): TROPIPOC in the last 168 hours.  BNPNo results for input(s): BNP, PROBNP in the last 168 hours.  DDimer No results for input(s): DDIMER in the last 168 hours.  Radiology/Studies:  DG Chest 2 View  Result Date: 11/15/2020 IMPRESSION: No acute chest findings. Electronically Signed   By: Keith Rake M.D.    On: 11/15/2020 17:42   CT Angio Chest/Abd/Pel for Dissection W and/or Wo Contrast  Result Date: 11/15/2020 IMPRESSION: 1. No evidence of thoracoabdominal aortic aneurysm or dissection. 2. Moderate coronary artery atherosclerosis greatest in the right  coronary and LAD distributions. 3. No acute intrathoracic, intra-abdominal, or intrapelvic process. 4. Hepatic steatosis. Electronically Signed   By: Randa Ngo M.D.   On: 11/15/2020 20:53    Assessment and Plan:   1. Elevated troponin: -Likely supply demand ischemia in the setting of hypertensive urgency with underlying CKD -Pain is reproducible to palpation on exam -Symptoms are atypical, though he does have significant risk factors including coronary artery calcifications, DM, HTN, morbid obesity  -Echo pending, with preliminary read showing systolic dysfunction  -Consider outpatient ischemic evaluation  -ASA -Stop heparin gtt  -Check drug screen  -He may eat  2. Hypertensive urgency: -Significant family history  -BP remains elevated -Stop metoprolol and minoxidil  -Start labetalol  -Amlodipine  -Losartan -If needed, could add clonidine, Imdur, or hydralazine   3. CKD stage III: -Appears to be at his baseline -Possibly hypertensive and diabetic in etiology  -Hold HCTZ  4. DM2: -A1c pending -Per IM  5. Morbid obesity/OSA: -Weight loss is advised  -Needs a new CPAP    For questions or updates, please contact [place] Please consult [email or web address] for contact info under Cardiology/STEMI.   Signed, Christell Faith, PA-C [place] Pager: [phone] 11/16/2020, 8:23 AM

## 2020-11-16 NOTE — Discharge Instructions (Signed)

## 2020-11-16 NOTE — Consult Note (Signed)
ANTICOAGULATION CONSULT NOTE  Pharmacy Consult for Heparin Infusion Indication: chest pain/ACS  Patient Measurements: Heparin Dosing Weight: 118.2 kg  Labs: Recent Labs    11/15/20 1708 11/15/20 1918 11/15/20 2245 11/16/20 0025 11/16/20 0500  HGB 17.5*  --   --   --  16.3  HCT 48.6  --   --   --  44.5  PLT 312  --   --   --  264  APTT  --   --  38*  --   --   LABPROT  --   --  13.6  --   --   INR  --   --  1.1  --   --   HEPARINUNFRC  --   --   --   --  0.24*  CREATININE 1.56*  --   --   --   --   TROPONINIHS 65* 66* 66* 65*  --     Estimated Creatinine Clearance: 91.3 mL/min (A) (by C-G formula based on SCr of 1.56 mg/dL (H)).   Medications:  No anticoagulation prior to admission per my chart review  Assessment: Patient is a 45 y/o M with medical history including HTN, CKD, diabetes, gastric reflux who presented to the ED 3/16 with chest pain. Patient noted to have elevated BP. Trending troponin (65 >> 66). No ST elevation noted on EKG. Pharmacy has been consulted to initiate heparin infusion for ACS.  Baseline CBC within normal limits. Baseline aPTT and PT-INR pending.   Goal of Therapy:  Heparin level 0.3-0.7 units/ml Monitor platelets by anticoagulation protocol: Yes   Plan:  3/17:  HL @ 0500 = 0.24 Will order heparin 1750 units IV X 1 bolus and increase drip rate to 1700 units/hr.  Will recheck HL 6 hrs after rate change.   Zarya Lasseigne D 11/16/2020,6:43 AM

## 2020-11-18 NOTE — Progress Notes (Deleted)
Cardiology Office Note    Date:  11/18/2020   ID:  Ricky Moore, DOB 28-Oct-1975, MRN [id number]  PCP:  Elisabeth Cara, PA-C  Cardiologist:  Ida Rogue, MD  Electrophysiologist:  None   Chief Complaint: Hospital follow up  History of Present Illness:   Ricky Moore is a 45 y.o. male with history of DM, CKD stage III, HTN, morbid obesity, and OSA who presents for hospital follow-up after recent hospital admission from 3/16 through 3/17 for chest pain.  Prior to this admission he had no previously known cardiac history.  He presented to [facility] after being seen at an outside office on 11/15/2020 with a 10-day history of chest pressure that was constant and waxed and waned in intensity that was worse with exertion and radiated to his back.  It was noted that the pain developed after watching "a boring movie."  BP was elevated in the 220U to 542H systolic.  EKG showed sinus rhythm with LVH and nonspecific ST-T changes.  Chest x-ray showed no acute findings.  CTA of the chest showed no evidence of thoracoabdominal aneurysm with coronary artery calcifications and hepatic steatosis noted.  Initial high-sensitivity troponin of 65 with a delta and peak of 66, subsequently downtrending.  On exam, his symptoms were reproducible to palpation.  Echo demonstrated a cardiomyopathy with an EF of 35 to 40%, global hypokinesis, grade 2 diastolic dysfunction, normal RV systolic function and ventricular cavity size, mildly dilated left atrium, and mild mitral regurgitation.  He was evaluated by Dr. Rockey Situ with recommendation to treat musculoskeletal etiology of chest pain.  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  Given renal dysfunction it was recommended HCTZ be held and he was continued on amlodipine and losartan with the addition of labetalol.  ***   Labs independently reviewed: 10/2020 - Hgb 16.3, PLT 264, TC 223, TG 116, HDL 46, LDL 154, A1c 5.8, albumin 4.2, AST/ALT normal,  potassium 3.9, BUN 18, serum creatinine 1.56  Past Medical History:  Diagnosis Date  . Bladder tumor   . Complication of anesthesia    SLIGHT NAUSEA  . Diabetes mellitus without complication ([place])   . GERD (gastroesophageal reflux disease)    RELATED TO CERTAIN FOODS  . Hematuria   . Hypertension   . Sickle cell trait ([place])   . Sleep apnea    USES C PAP    Past Surgical History:  Procedure Laterality Date  . CYSTOSCOPY WITH RETROGRADE PYELOGRAM, URETEROSCOPY AND STENT PLACEMENT Bilateral 06/14/2015   Procedure: CYSTOSCOPY WITH BIOPSY, FULGERATION,  BILATERAL RETROGRADE PYELOGRAM;  Surgeon: Alexis Frock, MD;  Location: [facility];  Service: Urology;  Laterality: Bilateral;  . TONSILLECTOMY  2008  . TRANSURETHRAL RESECTION OF BLADDER TUMOR WITH GYRUS (TURBT-GYRUS)  2010  . VASECTOMY Bilateral 06/14/2015   Procedure: VASECTOMY;  Surgeon: Alexis Frock, MD;  Location: [facility];  Service: Urology;  Laterality: Bilateral;    Current Medications: No outpatient medications have been marked as taking for the 11/24/20 encounter (Appointment) with Rise Mu, PA-C.    Allergies:   Lisinopril, Molds & smuts, and Other   Social History   Socioeconomic History  . Marital status: Married    Spouse name: Not on file  . Number of children: Not on file  . Years of education: Not on file  . Highest education level: Not on file  Occupational History  . Not on file  Tobacco Use  . Smoking status: Never Smoker  . Smokeless tobacco: Never Used  Substance and Sexual Activity  . Alcohol use: Yes    Alcohol/week: 0.0 standard drinks    Comment: OCCASIONAL  . Drug use: No  . Sexual activity: Not on file  Other Topics Concern  . Not on file  Social History Narrative   ** Merged History Encounter **       Social Determinants of Health   Financial Resource Strain: Not on file  Food Insecurity: Not on file  Transportation Needs: Not on file  Physical Activity: Not on file  Stress: Not  on file  Social Connections: Not on file     Family History:  The patient's family history includes Diabetes Mellitus II in his maternal aunt.  ROS:   ROS   EKGs/Labs/Other Studies Reviewed:    Studies reviewed were summarized above. The additional studies were reviewed today:  1. Left ventricular ejection fraction, by estimation, is 35 to 40%. The  left ventricle has moderately decreased function. The left ventricle  demonstrates global hypokinesis. There is mild left ventricular  hypertrophy. Left ventricular diastolic  parameters are consistent with Grade II diastolic dysfunction  (pseudonormalization). The average left ventricular global longitudinal  strain is -8.5 %. The global longitudinal strain is abnormal.  2. Right ventricular systolic function is normal. The right ventricular  size is normal.  3. Left atrial size was mildly dilated.  4. The mitral valve is normal in structure. Mild mitral valve  regurgitation.    EKG:  EKG is ordered today.  The EKG ordered today demonstrates ***  Recent Labs: 11/15/2020: ALT 21; BUN 18; Potassium 3.9; Sodium 140 11/16/2020: Creatinine, Ser 1.52; Hemoglobin 16.3; Platelets 264  Recent Lipid Panel    Component Value Date/Time   CHOL 223 (H) 11/15/2020 2245   TRIG 116 11/15/2020 2245   HDL 46 11/15/2020 2245   CHOLHDL 4.8 11/15/2020 2245   VLDL 23 11/15/2020 2245   LDLCALC 154 (H) 11/15/2020 2245    PHYSICAL EXAM:    VS:  There were no vitals taken for this visit.  BMI: There is no height or weight on file to calculate BMI.  Physical Exam  Wt Readings from Last 3 Encounters:  11/15/20 (!) 321 lb 12.8 oz (146 kg)  02/10/18 (!) 309 lb (140.2 kg)  04/25/16 (!) 328 lb (148.8 kg)     ASSESSMENT & PLAN:   1. ***  Disposition: F/u with Dr. Rockey Situ or an APP in ***.   Medication Adjustments/Labs and Tests Ordered: Current medicines are reviewed at length with the patient today.  Concerns regarding medicines are  outlined above. Medication changes, Labs and Tests ordered today are summarized above and listed in the Patient Instructions accessible in Encounters.   Signed, Christell Faith, PA-C 11/18/2020 12:52 PM     [address] [place], [place] [id number] [phone]

## 2020-11-18 NOTE — Discharge Summary (Signed)
[facility] NAME: Ricky Moore    MR#:  [id number]  DATE OF BIRTH:  1976/06/27  DATE OF ADMISSION:  11/15/2020   ADMITTING PHYSICIAN: Athena Masse, MD  DATE OF DISCHARGE: 11/16/2020 11:53 AM  PRIMARY CARE PHYSICIAN: Belva Bertin, [place], PA-C   ADMISSION DIAGNOSIS:  Chest pain [R07.9] DISCHARGE DIAGNOSIS:  Principal Problem:   NSTEMI (non-ST elevated myocardial infarction) ([place]) Active Problems:   Type 2 diabetes mellitus with stage 3 chronic kidney disease (HCC)   Hypertension   Obesity, Class III, BMI 40-49.9 (morbid obesity) ([place])   Chronic kidney disease, stage 3a ([place])   Hypertensive urgency   Coronary artery arteriosclerosis   OSA on CPAP   Elevated troponin  SECONDARY DIAGNOSIS:   Past Medical History:  Diagnosis Date  . Bladder tumor   . Complication of anesthesia    SLIGHT NAUSEA  . Diabetes mellitus without complication ([place])   . GERD (gastroesophageal reflux disease)    RELATED TO CERTAIN FOODS  . Hematuria   . Hypertension   . Sickle cell trait ([place])   . Sleep apnea    USES C PAP   HOSPITAL COURSE:  45 year old male with history of diabetes, CKD IIIa, HTN, OSA, morbid obesity presenting with 1 week of intermittent chest pain with both typical and atypical features.  Chest pain - noncardiac. musculo-skeletal -seen by cardio. No further w/up    Hypertensive urgency -BP elevated in the ER to 193/120, 176/124 Improved with BP meds. May need adjustment as an outpt    Type 2 diabetes mellitus with stage 3 chronic kidney disease (HCC)    Obesity, Class III, BMI 40-49.9 (morbid obesity) ([place]) -Complicating factor to overall prognosis and care    Chronic kidney disease, stage 3a ([place]) -Renal function at baseline    OSA on CPAP Needs new CPAP machine as it's not working.    DISCHARGE CONDITIONS:  stable CONSULTS OBTAINED:  Treatment Team:  Minna Merritts, MD DRUG ALLERGIES:   Allergies  Allergen  Reactions  . Lisinopril Other (See Comments)    ALL Medications that end in "PRIL" cause severe coughing  . Molds & Smuts Swelling    ITCHY & BURNING EYES ITCHY & BURNING EYES   . Other Anaphylaxis    Medications that end in "pril" cause severe coughing   DISCHARGE MEDICATIONS:   Allergies as of 11/16/2020      Reactions   Lisinopril Other (See Comments)   ALL Medications that end in "PRIL" cause severe coughing   Molds & Smuts Swelling   ITCHY & BURNING EYES ITCHY & BURNING EYES   Other Anaphylaxis   Medications that end in "pril" cause severe coughing      Medication List    STOP taking these medications   acetaminophen-codeine 300-30 MG tablet Commonly known as: TYLENOL #3   hydrochlorothiazide 25 MG tablet Commonly known as: HYDRODIURIL   minoxidil 10 MG tablet Commonly known as: LONITEN     TAKE these medications   amLODipine 10 MG tablet Commonly known as: NORVASC Take 10 mg by mouth daily.   aspirin 81 MG EC tablet Take 1 tablet (81 mg total) by mouth daily. Swallow whole.   atorvastatin 40 MG tablet Commonly known as: LIPITOR Take 1 tablet (40 mg total) by mouth daily. What changed:   medication strength  how much to take   Flutter Devi Use as directed   gabapentin 300 MG capsule Commonly known as: NEURONTIN Take 300  mg by mouth at bedtime.   ibuprofen 600 MG tablet Commonly known as: ADVIL Take 1 tablet (600 mg total) by mouth every 6 (six) hours as needed.   insulin detemir 100 UNIT/ML injection Commonly known as: LEVEMIR Inject 30 Units into the skin at bedtime.   Jardiance 25 MG Tabs tablet Generic drug: empagliflozin Take 25 mg by mouth daily.   labetalol 200 MG tablet Commonly known as: NORMODYNE Take 1 tablet (200 mg total) by mouth 2 (two) times daily.   losartan 50 MG tablet Commonly known as: COZAAR Take 50 mg by mouth 2 (two) times daily.   Trulicity 3 OB/0.9GG Sopn Generic drug: Dulaglutide Inject 3 mg into the skin  once a week.      DISCHARGE INSTRUCTIONS:   DIET:  Cardiac diet DISCHARGE CONDITION:  Stable ACTIVITY:  Activity as tolerated OXYGEN:  Home Oxygen: No.  Oxygen Delivery: room air DISCHARGE LOCATION:  home   If you experience worsening of your admission symptoms, develop shortness of breath, life threatening emergency, suicidal or homicidal thoughts you must seek medical attention immediately by calling 911 or calling your MD immediately  if symptoms less severe.  You Must read complete instructions/literature along with all the possible adverse reactions/side effects for all the Medicines you take and that have been prescribed to you. Take any new Medicines after you have completely understood and accpet all the possible adverse reactions/side effects.   Please note  You were cared for by a hospitalist during your hospital stay. If you have any questions about your discharge medications or the care you received while you were in the hospital after you are discharged, you can call the unit and asked to speak with the hospitalist on call if the hospitalist that took care of you is not available. Once you are discharged, your primary care physician will handle any further medical issues. Please note that NO REFILLS for any discharge medications will be authorized once you are discharged, as it is imperative that you return to your primary care physician (or establish a relationship with a primary care physician if you do not have one) for your aftercare needs so that they can reassess your need for medications and monitor your lab values.    On the day of Discharge:  VITAL SIGNS:  Blood pressure (!) 156/103, pulse 73, temperature (!) 97.5 F (36.4 C), temperature source Oral, resp. rate 18, height 6\' 3"  (1.905 m), weight (!) 146 kg, SpO2 100 %. PHYSICAL EXAMINATION:  GENERAL:  45 y.o.-year-old patient lying in the bed with no acute distress.  EYES: Pupils equal, round, reactive to  light and accommodation. No scleral icterus. Extraocular muscles intact.  HEENT: Head atraumatic, normocephalic. Oropharynx and nasopharynx clear.  NECK:  Supple, no jugular venous distention. No thyroid enlargement, no tenderness.  LUNGS: Normal breath sounds bilaterally, no wheezing, rales,rhonchi or crepitation. No use of accessory muscles of respiration.  CARDIOVASCULAR: S1, S2 normal. No murmurs, rubs, or gallops.  ABDOMEN: Soft, non-tender, non-distended. Bowel sounds present. No organomegaly or mass.  EXTREMITIES: No pedal edema, cyanosis, or clubbing.  NEUROLOGIC: Cranial nerves II through XII are intact. Muscle strength 5/5 in all extremities. Sensation intact. Gait not checked.  PSYCHIATRIC: The patient is alert and oriented x 3.  SKIN: No obvious rash, lesion, or ulcer.  DATA REVIEW:   CBC Recent Labs  Lab 11/16/20 0500  WBC 7.1  HGB 16.3  HCT 44.5  PLT 264    Chemistries  Recent Labs  Lab  11/15/20 1708 11/15/20 1918 11/16/20 0501  NA 140  --   --   K 3.9  --   --   CL 106  --   --   CO2 25  --   --   GLUCOSE 93  --   --   BUN 18  --   --   CREATININE 1.56*  --  1.52*  CALCIUM 9.5  --   --   AST  --  23  --   ALT  --  21  --   ALKPHOS  --  75  --   BILITOT  --  0.9  --      Outpatient follow-up  Follow-up Information    [facility], [place], [place]. Schedule an appointment as soon as possible for a visit in 1 week.   Specialty: Family Medicine Contact information: [address] [place] [address] [phone]        Rise Mu, PA-C. Schedule an appointment as soon as possible for a visit on 11/24/2020.   Specialties: Physician Assistant, Cardiology, Radiology Why: @ 10am Contact information: [place] [address] [phone]                  Management plans discussed with the patient, family and they are in agreement.  CODE STATUS: Prior   TOTAL TIME TAKING CARE OF THIS PATIENT: 45  minutes.    Max Sane M.D on 11/18/2020 at 6:20 PM  [facility]   CC: Primary care physician; Belva Bertin, [place], PA-C   Note: This dictation was prepared with Dragon dictation along with smaller phrase technology. Any transcriptional errors that result from this process are unintentional.

## 2020-11-21 DIAGNOSIS — M940 Chondrocostal junction syndrome [Tietze]: Secondary | ICD-10-CM | POA: Diagnosis not present

## 2020-11-22 ENCOUNTER — Telehealth: Payer: Self-pay | Admitting: Physician Assistant

## 2020-11-22 NOTE — Telephone Encounter (Signed)
error 

## 2020-11-24 ENCOUNTER — Telehealth: Payer: Self-pay | Admitting: Cardiovascular Disease

## 2020-11-24 ENCOUNTER — Ambulatory Visit: Payer: BC Managed Care – PPO | Admitting: Physician Assistant

## 2020-11-24 NOTE — Telephone Encounter (Signed)
Patient calling  States that he needs some guidance on what to do with medications and if he can return to work while awaiting his upcoming appointment on 03/30 Please call to discuss

## 2020-11-24 NOTE — Telephone Encounter (Signed)
Bill RN in ED notified patient will be coming in for rule out and cath on Monday with Dr. Fletcher Anon.

## 2020-11-24 NOTE — Telephone Encounter (Signed)
° °  Spoke with interventional cardiology prior to contacting patient.  I had a lengthy conversation with Ricky Moore this afternoon regarding his recent hospitalization and current symptoms.  At this time he continues to experience constant 4-8 out of 10 chest pain that is only briefly improved with ibuprofen.  Overall, patient's presentation of chest pain is atypical however he does have significant risk factors for CAD including documented coronary artery calcification involving the LAD and RCA on recent CT chest, diabetes mellitus, hypertension, obesity, and male sex status.  Also of note, during his recent admission, he was noted to have a mild troponin elevation and an echo which demonstrated a new cardiomyopathy with an EF of 35 to 40% with global hypokinesis.  He was discharged prior to cardiac work-up.  It is interventional cardiology's recommendation that he return to the ER for admission and subsequent cardiac cath on 3/28 with Dr. Fletcher Anon.  We cannot directly admit the patient given he is having active chest pain and therefore he needs to present through the ED.  He is agreeable to this plan and will present to the ED today.  I will sign out this patient's case to our weekend covering APP to round on the patient on 3/26.  Again, patient unable to be directly admitted with active chest pain therefore he has been advised to proceed through the ED for hospital admission and cardiac cath on Monday, 3/28.    Risks and benefits of cardiac catheterization have been discussed with the patient including risks of bleeding, bruising, infection, kidney damage, stroke, heart attack, and death. The patient understands these risks and is willing to proceed with the procedure. All questions have been answered and concerns listened to.

## 2020-11-24 NOTE — Telephone Encounter (Signed)
I spoke with the patient. He was scheduled for a post hospital visit with Ricky Faith, PA this morning, but this was rescheduled to next week per the office.   The patient called with concerns as to what to do with his BP/ meds/ work related to chest pain.  The patient states Ricky Moore had mentioned to him to follow up with his PCP about the BP issues/ chest pain. He did see his PCP on Tuesday and was told he needed to follow up with our office for these issues.   Chest pain seems to be more musculoskeletal/ inflammatory in nature. This is made worse when he reaches his arms out, like when reaching for a keyboard. He was told by his PCP to use heat/ ibuprofen. He confirms he is doing this, but ~ 2 hours after he uses ibuprofen, his pain is back and will often require enough ibuprofen that it begins to hurt his stomach. He currently works for [employer]- his job requires sitting, but also working on [employer].  I have advised him that his chest pain sounds non-cardiac in nature and to continue with heat/ nsaids as advised by his PCP.  However, he is stating that his BP is also still running up. He was ~ 440'N systolic at [facility] on Tuesday. Today he is running 170/130. Earlier this week he had a reading of 027 systolic (did feel lethargic with this). He currently denies headaches, but is also feeling lethargic with the higher readings.  I have confirmed with him that he is taking: - amlodipine 10 mg daily (@ 8 am) - labetalol 200 mg BID (@ 8 am/ 5 pm) - losartan 50 mg BID (@ 8 am/ 5 pm)  I have advised the patient I will need to review his BP readings with Ricky Butts, PA as I am not comfortable with his DBP at 130. He is aware that I do not see an issue with him returning to work, but will ask Ricky Moore to weigh in on this as well.  He is scheduled to see Ricky Moore on 3/30 @ 2:30 pm.   The patient voices understanding and is agreeable.

## 2020-11-26 ENCOUNTER — Inpatient Hospital Stay
Admission: EM | Admit: 2020-11-26 | Discharge: 2020-11-28 | DRG: 247 | Disposition: A | Discharge status: Home / Self Care | Payer: BC Managed Care – PPO | Attending: Internal Medicine | Admitting: Internal Medicine

## 2020-11-26 ENCOUNTER — Emergency Department: Payer: BC Managed Care – PPO

## 2020-11-26 ENCOUNTER — Encounter: Payer: Self-pay | Admitting: Emergency Medicine

## 2020-11-26 ENCOUNTER — Other Ambulatory Visit: Payer: Self-pay

## 2020-11-26 DIAGNOSIS — E785 Hyperlipidemia, unspecified: Secondary | ICD-10-CM | POA: Diagnosis present

## 2020-11-26 DIAGNOSIS — Z833 Family history of diabetes mellitus: Secondary | ICD-10-CM

## 2020-11-26 DIAGNOSIS — G4733 Obstructive sleep apnea (adult) (pediatric): Secondary | ICD-10-CM | POA: Diagnosis not present

## 2020-11-26 DIAGNOSIS — Z888 Allergy status to other drugs, medicaments and biological substances status: Secondary | ICD-10-CM

## 2020-11-26 DIAGNOSIS — I5022 Chronic systolic (congestive) heart failure: Secondary | ICD-10-CM

## 2020-11-26 DIAGNOSIS — N400 Enlarged prostate without lower urinary tract symptoms: Secondary | ICD-10-CM | POA: Diagnosis present

## 2020-11-26 DIAGNOSIS — I25110 Atherosclerotic heart disease of native coronary artery with unstable angina pectoris: Secondary | ICD-10-CM

## 2020-11-26 DIAGNOSIS — D573 Sickle-cell trait: Secondary | ICD-10-CM | POA: Diagnosis not present

## 2020-11-26 DIAGNOSIS — I255 Ischemic cardiomyopathy: Secondary | ICD-10-CM

## 2020-11-26 DIAGNOSIS — E1143 Type 2 diabetes mellitus with diabetic autonomic (poly)neuropathy: Secondary | ICD-10-CM

## 2020-11-26 DIAGNOSIS — I2541 Coronary artery aneurysm: Secondary | ICD-10-CM | POA: Diagnosis present

## 2020-11-26 DIAGNOSIS — E1122 Type 2 diabetes mellitus with diabetic chronic kidney disease: Secondary | ICD-10-CM | POA: Diagnosis present

## 2020-11-26 DIAGNOSIS — I272 Pulmonary hypertension, unspecified: Secondary | ICD-10-CM | POA: Diagnosis not present

## 2020-11-26 DIAGNOSIS — N183 Chronic kidney disease, stage 3 unspecified: Secondary | ICD-10-CM | POA: Diagnosis not present

## 2020-11-26 DIAGNOSIS — Z7982 Long term (current) use of aspirin: Secondary | ICD-10-CM

## 2020-11-26 DIAGNOSIS — E1142 Type 2 diabetes mellitus with diabetic polyneuropathy: Secondary | ICD-10-CM | POA: Diagnosis not present

## 2020-11-26 DIAGNOSIS — Z6841 Body Mass Index (BMI) 40.0 and over, adult: Secondary | ICD-10-CM | POA: Diagnosis not present

## 2020-11-26 DIAGNOSIS — I249 Acute ischemic heart disease, unspecified: Secondary | ICD-10-CM | POA: Diagnosis not present

## 2020-11-26 DIAGNOSIS — I200 Unstable angina: Principal | ICD-10-CM

## 2020-11-26 DIAGNOSIS — I10 Essential (primary) hypertension: Secondary | ICD-10-CM | POA: Diagnosis not present

## 2020-11-26 DIAGNOSIS — I130 Hypertensive heart and chronic kidney disease with heart failure and stage 1 through stage 4 chronic kidney disease, or unspecified chronic kidney disease: Secondary | ICD-10-CM | POA: Diagnosis not present

## 2020-11-26 DIAGNOSIS — Z7984 Long term (current) use of oral hypoglycemic drugs: Secondary | ICD-10-CM | POA: Diagnosis not present

## 2020-11-26 DIAGNOSIS — Z79899 Other long term (current) drug therapy: Secondary | ICD-10-CM

## 2020-11-26 DIAGNOSIS — K219 Gastro-esophageal reflux disease without esophagitis: Secondary | ICD-10-CM | POA: Diagnosis present

## 2020-11-26 DIAGNOSIS — Z794 Long term (current) use of insulin: Secondary | ICD-10-CM | POA: Diagnosis not present

## 2020-11-26 DIAGNOSIS — I160 Hypertensive urgency: Secondary | ICD-10-CM | POA: Diagnosis present

## 2020-11-26 DIAGNOSIS — N1831 Chronic kidney disease, stage 3a: Secondary | ICD-10-CM | POA: Diagnosis present

## 2020-11-26 DIAGNOSIS — Z20822 Contact with and (suspected) exposure to covid-19: Secondary | ICD-10-CM | POA: Diagnosis not present

## 2020-11-26 DIAGNOSIS — E669 Obesity, unspecified: Secondary | ICD-10-CM | POA: Diagnosis not present

## 2020-11-26 DIAGNOSIS — R079 Chest pain, unspecified: Secondary | ICD-10-CM | POA: Diagnosis not present

## 2020-11-26 LAB — CBC
HCT: 44.9 % (ref 39.0–52.0)
Hemoglobin: 15.9 g/dL (ref 13.0–17.0)
MCH: 26.9 pg (ref 26.0–34.0)
MCHC: 35.4 g/dL (ref 30.0–36.0)
MCV: 76.1 fL — ABNORMAL LOW (ref 80.0–100.0)
Platelets: 281 10*3/uL (ref 150–400)
RBC: 5.9 MIL/uL — ABNORMAL HIGH (ref 4.22–5.81)
RDW: 15.1 % (ref 11.5–15.5)
WBC: 7.2 10*3/uL (ref 4.0–10.5)
nRBC: 0 % (ref 0.0–0.2)

## 2020-11-26 LAB — BASIC METABOLIC PANEL
Anion gap: 10 (ref 5–15)
BUN: 23 mg/dL — ABNORMAL HIGH (ref 6–20)
CO2: 24 mmol/L (ref 22–32)
Calcium: 9.5 mg/dL (ref 8.9–10.3)
Chloride: 106 mmol/L (ref 98–111)
Creatinine, Ser: 1.67 mg/dL — ABNORMAL HIGH (ref 0.61–1.24)
GFR, Estimated: 51 mL/min — ABNORMAL LOW (ref >60)
Glucose, Bld: 93 mg/dL (ref 70–99)
Potassium: 4.1 mmol/L (ref 3.5–5.1)
Sodium: 140 mmol/L (ref 135–145)

## 2020-11-26 LAB — RESP PANEL BY RT-PCR (FLU A&B, COVID) ARPGX2
Influenza A by PCR: NEGATIVE
Influenza B by PCR: NEGATIVE
SARS Coronavirus 2 by RT PCR: NEGATIVE

## 2020-11-26 LAB — PROTIME-INR
INR: 1.1 (ref 0.8–1.2)
Prothrombin Time: 13.9 seconds (ref 11.4–15.2)

## 2020-11-26 LAB — APTT: aPTT: 39 seconds — ABNORMAL HIGH (ref 24–36)

## 2020-11-26 LAB — TROPONIN I (HIGH SENSITIVITY)
Troponin I (High Sensitivity): 64 ng/L — ABNORMAL HIGH (ref <18)
Troponin I (High Sensitivity): 60 ng/L — ABNORMAL HIGH (ref <18)

## 2020-11-26 MED ORDER — EMPAGLIFLOZIN 25 MG PO TABS
25.0000 mg | ORAL_TABLET | Freq: Every day | ORAL | Status: DC
  Administered 2020-11-28: 25 mg via ORAL
  Filled 2020-11-26 (×2): qty 1

## 2020-11-26 MED ORDER — LOSARTAN POTASSIUM 50 MG PO TABS
50.0000 mg | ORAL_TABLET | Freq: Two times a day (BID) | ORAL | Status: DC
  Administered 2020-11-27 – 2020-11-28 (×4): 50 mg via ORAL
  Filled 2020-11-26 (×4): qty 1

## 2020-11-26 MED ORDER — ONDANSETRON HCL 4 MG/2ML IJ SOLN
4.0000 mg | Freq: Four times a day (QID) | INTRAMUSCULAR | Status: DC | PRN
  Administered 2020-11-27: 4 mg via INTRAVENOUS
  Filled 2020-11-26: qty 2

## 2020-11-26 MED ORDER — ASPIRIN 300 MG RE SUPP: 300.0000 mg | RECTAL | Status: AC

## 2020-11-26 MED ORDER — MORPHINE SULFATE (PF) 2 MG/ML IV SOLN: 2.0000 mg | INTRAVENOUS | Status: DC | PRN

## 2020-11-26 MED ORDER — ZOLPIDEM TARTRATE 5 MG PO TABS: 5.0000 mg | ORAL_TABLET | Freq: Every evening | ORAL | Status: DC | PRN

## 2020-11-26 MED ORDER — HEPARIN BOLUS VIA INFUSION
4000.0000 [IU] | Freq: Once | INTRAVENOUS | Status: AC
  Administered 2020-11-26: 4000 [IU] via INTRAVENOUS
  Filled 2020-11-26: qty 4000

## 2020-11-26 MED ORDER — NITROGLYCERIN 0.4 MG SL SUBL: 0.4000 mg | SUBLINGUAL_TABLET | SUBLINGUAL | Status: DC | PRN

## 2020-11-26 MED ORDER — GABAPENTIN 300 MG PO CAPS
300.0000 mg | ORAL_CAPSULE | Freq: Every day | ORAL | Status: DC
  Administered 2020-11-27: 300 mg via ORAL
  Filled 2020-11-26: qty 1

## 2020-11-26 MED ORDER — SODIUM CHLORIDE 0.9 % IV SOLN: INTRAVENOUS | Status: DC

## 2020-11-26 MED ORDER — INSULIN DETEMIR 100 UNIT/ML ~~LOC~~ SOLN
30.0000 [IU] | Freq: Every day | SUBCUTANEOUS | Status: DC
  Administered 2020-11-27: 30 [IU] via SUBCUTANEOUS
  Filled 2020-11-26 (×2): qty 0.3

## 2020-11-26 MED ORDER — HEPARIN (PORCINE) 25000 UT/250ML-% IV SOLN
1600.0000 [IU]/h | INTRAVENOUS | Status: DC
  Administered 2020-11-26 – 2020-11-27 (×2): 1600 [IU]/h via INTRAVENOUS
  Filled 2020-11-26 (×2): qty 250

## 2020-11-26 MED ORDER — ACETAMINOPHEN 325 MG PO TABS: 650.0000 mg | ORAL_TABLET | ORAL | Status: DC | PRN

## 2020-11-26 MED ORDER — ASPIRIN EC 325 MG PO TBEC: 325.0000 mg | DELAYED_RELEASE_TABLET | Freq: Every day | ORAL | Status: DC

## 2020-11-26 MED ORDER — AMLODIPINE BESYLATE 10 MG PO TABS
10.0000 mg | ORAL_TABLET | Freq: Every day | ORAL | Status: DC
  Administered 2020-11-27 – 2020-11-28 (×2): 10 mg via ORAL
  Filled 2020-11-26: qty 2
  Filled 2020-11-26: qty 1

## 2020-11-26 MED ORDER — ASPIRIN 81 MG PO CHEW
324.0000 mg | CHEWABLE_TABLET | ORAL | Status: AC
  Administered 2020-11-27: 324 mg via ORAL
  Filled 2020-11-26: qty 4

## 2020-11-26 MED ORDER — ASPIRIN EC 81 MG PO TBEC: 81.0000 mg | DELAYED_RELEASE_TABLET | Freq: Every day | ORAL | Status: DC

## 2020-11-26 MED ORDER — LABETALOL HCL 200 MG PO TABS
200.0000 mg | ORAL_TABLET | Freq: Two times a day (BID) | ORAL | Status: DC
  Administered 2020-11-27: 200 mg via ORAL
  Filled 2020-11-26 (×3): qty 1
  Filled 2020-11-26: qty 2

## 2020-11-26 MED ORDER — ATORVASTATIN CALCIUM 20 MG PO TABS
40.0000 mg | ORAL_TABLET | Freq: Every day | ORAL | Status: DC
  Administered 2020-11-28: 40 mg via ORAL
  Filled 2020-11-26: qty 2

## 2020-11-26 MED ORDER — ALPRAZOLAM 0.25 MG PO TABS: 0.2500 mg | ORAL_TABLET | Freq: Two times a day (BID) | ORAL | Status: DC | PRN

## 2020-11-26 MED ORDER — DULAGLUTIDE 3 MG/0.5ML ~~LOC~~ SOAJ: 3.0000 mg | SUBCUTANEOUS | Status: DC

## 2020-11-26 MED ORDER — INSULIN ASPART 100 UNIT/ML ~~LOC~~ SOLN
0.0000 [IU] | Freq: Three times a day (TID) | SUBCUTANEOUS | Status: DC
  Administered 2020-11-28: 2 [IU] via SUBCUTANEOUS
  Filled 2020-11-26 (×2): qty 1

## 2020-11-26 NOTE — Consult Note (Signed)
[facility] for heparin Indication: chest pain/ACS  Allergies  Allergen Reactions  . Lisinopril Other (See Comments)    ALL Medications that end in "PRIL" cause severe coughing  . Molds & Smuts Swelling    ITCHY & BURNING EYES ITCHY & BURNING EYES   . Other Anaphylaxis    Medications that end in "pril" cause severe coughing    Patient Measurements: Height: 6\' 3"  (190.5 cm) Weight: (!) 145.2 kg (320 lb) IBW/kg (Calculated) : 84.5 Heparin Dosing Weight: 117.5 kg  Vital Signs: Temp: 98.4 F (36.9 C) (03/27 1742) Temp Source: Oral (03/27 1742) BP: 163/95 (03/27 1953) Pulse Rate: 78 (03/27 1953)  Labs: Recent Labs    11/26/20 1836  HGB 15.9  HCT 44.9  PLT 281  CREATININE 1.67*  TROPONINIHS 64*    Estimated Creatinine Clearance: 85.1 mL/min (A) (by C-G formula based on SCr of 1.67 mg/dL (H)).   Medical History: Past Medical History:  Diagnosis Date  . Bladder tumor   . Complication of anesthesia    SLIGHT NAUSEA  . Diabetes mellitus without complication ([place])   . GERD (gastroesophageal reflux disease)    RELATED TO CERTAIN FOODS  . Hematuria   . Hypertension   . Sickle cell trait ([place])   . Sleep apnea    USES C PAP    Medications:  No PTA APT or AC  Assessment: 45 y.o. male presents emergency department complaining of continued substernal chest pain that started 2 weeks ago. He was advised to come to the ED and be admitted so that Dr. Fletcher Anon can do a catheterization for Monday. Pharmacy has been consulted for heparin dosing.   Heparin Dosing Weight: 117.5 kg  Goal of Therapy:  Heparin level 0.3-0.7 units/ml Monitor platelets by anticoagulation protocol: Yes   Plan:  Give 4000 units bolus x 1 Start heparin infusion at 1600 units/hr Check anti-Xa level in 6 hours and daily while on heparin Continue to monitor H&H and platelets  Darnelle Bos 11/26/2020,8:04 PM

## 2020-11-26 NOTE — ED Provider Notes (Signed)
[facility] Emergency Department Provider Note  ____________________________________________   Event Date/Time   First MD Initiated Contact with Patient 11/26/20 1923     (approximate)  I have reviewed the triage vital signs and the nursing notes.   HISTORY  Chief Complaint Chest Pain    HPI Ricky Moore is a 45 y.o. male presents emergency department complaining of continued substernal chest pain that started 2 weeks ago.  Patient had been admitted to the hospital and a cardiac work-up was performed after discharge.  However the patient continues to have chest pain and was discussing this with an interventional cardiologist.  They have noted that they would like him to come back to the emergency department and be admitted so that Dr. Fletcher Anon can do a catheterization for Monday   Past Medical History:  Diagnosis Date  . Bladder tumor   . Complication of anesthesia    SLIGHT NAUSEA  . Diabetes mellitus without complication ([place])   . GERD (gastroesophageal reflux disease)    RELATED TO CERTAIN FOODS  . Hematuria   . Hypertension   . Sickle cell trait ([place])   . Sleep apnea    USES C PAP    Patient Active Problem List   Diagnosis Date Noted  . ACS (acute coronary syndrome) ([place]) 11/26/2020  . Elevated troponin   . Chronic kidney disease, stage 3a ([place]) 11/15/2020  . Hypertensive urgency 11/15/2020  . Coronary artery arteriosclerosis 11/15/2020  . NSTEMI (non-ST elevated myocardial infarction) ([place]) 11/15/2020  . OSA on CPAP 11/15/2020  . Obesity, Class III, BMI 40-49.9 (morbid obesity) ([place]) 03/10/2016  . Upper airway cough syndrome 03/08/2016  . Acute renal failure ([place]) 07/14/2014  . Type 2 diabetes mellitus with stage 3 chronic kidney disease ([place]) 07/14/2014  . Mass of bladder 07/14/2014  . CKD (chronic kidney disease) 07/14/2014  . Hypertension 07/14/2014  . Hyperglycemia 07/14/2014    Past Surgical History:  Procedure Laterality Date   . CYSTOSCOPY WITH RETROGRADE PYELOGRAM, URETEROSCOPY AND STENT PLACEMENT Bilateral 06/14/2015   Procedure: CYSTOSCOPY WITH BIOPSY, FULGERATION,  BILATERAL RETROGRADE PYELOGRAM;  Surgeon: Alexis Frock, MD;  Location: [facility] OR[facility];  Service: Urology;  Laterality: Bilateral;  . TONSILLECTOMY  2008  . TRANSURETHRAL RESECTION OF BLADDER TUMOR WITH GYRUS (TURBT-GYRUS)  2010  . VASECTOMY Bilateral 06/14/2015   Procedure: VASECTOMY;  Surgeon: Alexis Frock, MD;  Location: [facility];  Service: Urology;  Laterality: Bilateral;    Prior to Admission medications   Medication Sig Start Date End Date Taking? Authorizing Provider  amLODipine (NORVASC) 10 MG tablet Take 10 mg by mouth daily. 10/18/20   [provider]  aspirin EC 81 MG EC tablet Take 1 tablet (81 mg total) by mouth daily. Swallow whole. 11/17/20   Max Sane, MD  atorvastatin (LIPITOR) 40 MG tablet Take 1 tablet (40 mg total) by mouth daily. 11/17/20 12/17/20  Max Sane, MD  gabapentin (NEURONTIN) 300 MG capsule Take 300 mg by mouth at bedtime.    [provider]  ibuprofen (ADVIL) 600 MG tablet Take 1 tablet (600 mg total) by mouth every 6 (six) hours as needed. 11/16/20   Max Sane, MD  insulin detemir (LEVEMIR) 100 UNIT/ML injection Inject 30 Units into the skin at bedtime.    [provider]  JARDIANCE 25 MG TABS tablet Take 25 mg by mouth daily. 10/18/20   [provider]  labetalol (NORMODYNE) 200 MG tablet Take 1 tablet (200 mg total) by mouth 2 (two) times daily. 11/16/20  12/16/20  Max Sane, MD  losartan (COZAAR) 50 MG tablet Take 50 mg by mouth 2 (two) times daily.    [provider]  Respiratory Therapy Supplies (FLUTTER) DEVI Use as directed 03/28/16   Tanda Rockers, MD  TRULICITY 3 FV/4.9SW SOPN Inject 3 mg into the skin once a week. 08/17/20   [provider]    Allergies Lisinopril, Molds & smuts, and Other  Family History  Problem Relation Age of Onset  . Diabetes Mellitus  II Maternal Aunt     Social History Social History   Tobacco Use  . Smoking status: Never Smoker  . Smokeless tobacco: Never Used  Substance Use Topics  . Alcohol use: Yes    Alcohol/week: 0.0 standard drinks    Comment: OCCASIONAL  . Drug use: No    Review of Systems  Constitutional: No fever/chills Eyes: No visual changes. ENT: No sore throat. Respiratory: Denies cough Cardiovascular: Positive chest pain Gastrointestinal: Denies abdominal pain Genitourinary: Negative for dysuria. Musculoskeletal: Negative for back pain. Skin: Negative for rash. Psychiatric: no mood changes,     ____________________________________________   PHYSICAL EXAM:  VITAL SIGNS: ED Triage Vitals  Enc Vitals Group     BP 11/26/20 1743 (!) 165/97     Pulse Rate 11/26/20 1742 (!) 2     Resp 11/26/20 1742 16     Temp 11/26/20 1742 98.4 F (36.9 C)     Temp Source 11/26/20 1742 Oral     SpO2 11/26/20 1742 99 %     Weight 11/26/20 1740 (!) 320 lb (145.2 kg)     Height 11/26/20 1740 6\' 3"  (1.905 m)     Head Circumference --      Peak Flow --      Pain Score 11/26/20 1740 4     Pain Loc --      Pain Edu? --      Excl. in [place]? --     Constitutional: Alert and oriented. Well appearing and in no acute distress. Eyes: Conjunctivae are normal.  Head: Atraumatic. Nose: No congestion/rhinnorhea. Mouth/Throat: Mucous membranes are moist.   Neck:  supple no lymphadenopathy noted Cardiovascular: Normal rate, regular rhythm. Heart sounds are normal Respiratory: Normal respiratory effort.  No retractions, lungs c t a  GU: deferred Musculoskeletal: FROM all extremities, warm and well perfused Neurologic:  Normal speech and language.  Skin:  Skin is warm, dry and intact. No rash noted. Psychiatric: Mood and affect are normal. Speech and behavior are normal.  ____________________________________________   LABS (all labs ordered are listed, but only abnormal results are displayed)  Labs  Reviewed  BASIC METABOLIC PANEL - Abnormal; Notable for the following components:      Result Value   BUN 23 (*)    Creatinine, Ser 1.67 (*)    GFR, Estimated 51 (*)    All other components within normal limits  CBC - Abnormal; Notable for the following components:   RBC 5.90 (*)    MCV 76.1 (*)    All other components within normal limits  TROPONIN I (HIGH SENSITIVITY) - Abnormal; Notable for the following components:   Troponin I (High Sensitivity) 64 (*)    All other components within normal limits  RESP PANEL BY RT-PCR (FLU A&B, COVID) ARPGX2  PROTIME-INR  APTT  TROPONIN I (HIGH SENSITIVITY)   ____________________________________________   ____________________________________________  RADIOLOGY    ____________________________________________   PROCEDURES  Procedure(s) performed: No  Procedures    ____________________________________________   INITIAL  IMPRESSION / ASSESSMENT AND PLAN / ED COURSE  Pertinent labs & imaging results that were available during my care of the patient were reviewed by me and considered in my medical decision making (see chart for details).   Patient is 45 year old male with history of diabetes, hypertension chest pain presents to the emergency department stating that cardiology wanted him to come to the ED for admission.  They are going to do a heart catheterization tomorrow.  See HPI  Physical exam shows patient appears stable.  DDx: MI, nonspecific chest pain, unstable angina  CBC basically normal, basic metabolic panel has elevated BUN of 23 which is an increase from 10 days ago, creatinine is increased to 1.67 from 10 days ago also, troponin is still elevated at 64, this is in line with his trend over the past 10 days.  PT PTT are pending, Covid test pending,  Chest x-ray reviewed by me confirmed by radiology to be normal,  EKG see physician read  Consult with cardiology.  Dr Acie Fredrickson states to start heparin for the pending  catheterization.  Paged hospitalist for admission   Dr Sidney Ace to admit  Ricky Moore was evaluated in Emergency Department on 11/26/2020 for the symptoms described in the history of present illness. He was evaluated in the context of the global COVID-19 pandemic, which necessitated consideration that the patient might be at risk for infection with the SARS-CoV-2 virus that causes COVID-19. Institutional protocols and algorithms that pertain to the evaluation of patients at risk for COVID-19 are in a state of rapid change based on information released by regulatory bodies including the CDC and federal and state organizations. These policies and algorithms were followed during the patient's care in the ED.    As part of my medical decision making, I reviewed the following data within the [facility] notes reviewed and incorporated, Labs reviewed , EKG interpreted NSR, see physician read, Old chart reviewed, Radiograph reviewed , Discussed with admitting physician , A consult was requested and obtained from this/these consultant(s) Cardiology, Evaluated by EM attending Dr. Nickolas Madrid, Notes from prior ED visits and [place] Controlled Substance Database  ____________________________________________   FINAL CLINICAL IMPRESSION(S) / ED DIAGNOSES  Final diagnoses:  Unstable angina ([place])      NEW MEDICATIONS STARTED DURING THIS VISIT:  New Prescriptions   No medications on file     Note:  This document was prepared using Dragon voice recognition software and may include unintentional dictation errors.    Versie Starks, PA-C 11/26/20 2013    Vanessa [place], MD 11/27/20 1213

## 2020-11-26 NOTE — H&P (Signed)
[place]   PATIENT NAME: Ricky Moore    MR#:  [id number]  DATE OF BIRTH:  05-22-1976  DATE OF ADMISSION:  11/26/2020  PRIMARY CARE PHYSICIAN: Belva Bertin, [place], [place]   Patient is coming from: Home   REQUESTING/REFERRING PHYSICIAN: Versie Starks, PA-C CHIEF COMPLAINT:   Chief Complaint  Patient presents with  . Chest Pain    HISTORY OF PRESENT ILLNESS:  Ricky Moore is a 45 y.o. African-American male with medical history significant for hypertension, obstructive sleep apnea, type 2 diabetes mellitus, obstructive sleep apnea and GERD, who presented to the emergency room with acute onset of continued intermittent substernal chest pain that started a couple weeks ago and he was placed on observation overnight on 3/16 and seen by Dr. Rockey Situ and at that time no further work-up was recommended.  His hypertensive urgency was managed none. He had a recent 2D echo on 11/16/2020 revealing an EF of 35 to 13%, grade 2 diastolic dysfunction, mild LVH, mild left atrial dilatation and mild mitral regurgitation. He describes it as a sharp pain and graded at 4/10 in severity.  He admitted to associated dyspnea without palpitations with this pain.  No nausea or vomiting or diaphoresis.  No leg pain or edema recent travels or surgeries.  Today the cardiologist recommendation today was for him to come to the ER for potential need for cardiac catheterization in a.m.   ED Course: When she came to the ER blood pressure was 165/97 and later 170/101 with otherwise normal vital signs and later respiratory it was 23 then 12.  EKG as reviewed by me : Showed normal sinus rhythm with rate of 87 with moderate voltage criteria for LVH and Q waves anteroseptally. Imaging: Two-view chest x-ray showed no acute cardiopulmonary disease.  The patient was given IV heparin bolus and drip.  He will be admitted to an observation progressive unit bed for further evaluation and management. PAST MEDICAL HISTORY:    Past Medical History:  Diagnosis Date  . Bladder tumor   . Complication of anesthesia    SLIGHT NAUSEA  . Diabetes mellitus without complication ([place])   . GERD (gastroesophageal reflux disease)    RELATED TO CERTAIN FOODS  . Hematuria   . Hypertension   . Sickle cell trait ([place])   . Sleep apnea    USES C PAP    PAST SURGICAL HISTORY:   Past Surgical History:  Procedure Laterality Date  . CYSTOSCOPY WITH RETROGRADE PYELOGRAM, URETEROSCOPY AND STENT PLACEMENT Bilateral 06/14/2015   Procedure: CYSTOSCOPY WITH BIOPSY, FULGERATION,  BILATERAL RETROGRADE PYELOGRAM;  Surgeon: Alexis Frock, MD;  Location: [facility];  Service: Urology;  Laterality: Bilateral;  . TONSILLECTOMY  2008  . TRANSURETHRAL RESECTION OF BLADDER TUMOR WITH GYRUS (TURBT-GYRUS)  2010  . VASECTOMY Bilateral 06/14/2015   Procedure: VASECTOMY;  Surgeon: Alexis Frock, MD;  Location: [facility];  Service: Urology;  Laterality: Bilateral;    SOCIAL HISTORY:   Social History   Tobacco Use  . Smoking status: Never Smoker  . Smokeless tobacco: Never Used  Substance Use Topics  . Alcohol use: Yes    Alcohol/week: 0.0 standard drinks    Comment: OCCASIONAL    FAMILY HISTORY:   Family History  Problem Relation Age of Onset  . Diabetes Mellitus II Maternal Aunt     DRUG ALLERGIES:   Allergies  Allergen Reactions  . Lisinopril Other (See Comments)    ALL Medications that end in "PRIL" cause severe coughing  .  Molds & Smuts Swelling    ITCHY & BURNING EYES ITCHY & BURNING EYES   . Other Anaphylaxis    Medications that end in "pril" cause severe coughing    REVIEW OF SYSTEMS:   ROS As per history of present illness. All pertinent systems were reviewed above. Constitutional, HEENT, cardiovascular, respiratory, GI, GU, musculoskeletal, neuro, psychiatric, endocrine, integumentary and hematologic systems were reviewed and are otherwise negative/unremarkable except for positive findings mentioned above in the  HPI.   MEDICATIONS AT HOME:   Prior to Admission medications   Medication Sig Start Date End Date Taking? Authorizing Provider  amLODipine (NORVASC) 10 MG tablet Take 10 mg by mouth daily. 10/18/20   [provider]  aspirin EC 81 MG EC tablet Take 1 tablet (81 mg total) by mouth daily. Swallow whole. 11/17/20   Max Sane, MD  atorvastatin (LIPITOR) 40 MG tablet Take 1 tablet (40 mg total) by mouth daily. 11/17/20 12/17/20  Max Sane, MD  gabapentin (NEURONTIN) 300 MG capsule Take 300 mg by mouth at bedtime.    [provider]  ibuprofen (ADVIL) 600 MG tablet Take 1 tablet (600 mg total) by mouth every 6 (six) hours as needed. 11/16/20   Max Sane, MD  insulin detemir (LEVEMIR) 100 UNIT/ML injection Inject 30 Units into the skin at bedtime.    [provider]  JARDIANCE 25 MG TABS tablet Take 25 mg by mouth daily. 10/18/20   [provider]  labetalol (NORMODYNE) 200 MG tablet Take 1 tablet (200 mg total) by mouth 2 (two) times daily. 11/16/20 12/16/20  Max Sane, MD  losartan (COZAAR) 50 MG tablet Take 50 mg by mouth 2 (two) times daily.    [provider]  Respiratory Therapy Supplies (FLUTTER) DEVI Use as directed 03/28/16   Tanda Rockers, MD  TRULICITY 3 BD/5.3GD SOPN Inject 3 mg into the skin once a week. 08/17/20   [provider]      VITAL SIGNS:  Blood pressure (!) 163/95, pulse 78, temperature 98.4 F (36.9 C), temperature source Oral, resp. rate (!) 23, height 6\' 3"  (1.905 m), weight (!) 145.2 kg, SpO2 98 %.  PHYSICAL EXAMINATION:  Physical Exam  GENERAL:  44 y.o.-year-old African-American male patient lying in the bed with no acute distress.  EYES: Pupils equal, round, reactive to light and accommodation. No scleral icterus. Extraocular muscles intact.  HEENT: Head atraumatic, normocephalic. Oropharynx and nasopharynx clear.  NECK:  Supple, no jugular venous distention. No thyroid enlargement, no tenderness.  LUNGS:  Normal breath sounds bilaterally, no wheezing, rales,rhonchi or crepitation. No use of accessory muscles of respiration.  CARDIOVASCULAR: Regular rate and rhythm, S1, S2 normal. No murmurs, rubs, or gallops.  ABDOMEN: Soft, nondistended, nontender. Bowel sounds present. No organomegaly or mass.  EXTREMITIES: No pedal edema, cyanosis, or clubbing.  NEUROLOGIC: Cranial nerves II through XII are intact. Muscle strength 5/5 in all extremities. Sensation intact. Gait not checked.  PSYCHIATRIC: The patient is alert and oriented x 3.  Normal affect and good eye contact. SKIN: No obvious rash, lesion, or ulcer.   LABORATORY PANEL:   CBC Recent Labs  Lab 11/26/20 1836  WBC 7.2  HGB 15.9  HCT 44.9  PLT 281   ------------------------------------------------------------------------------------------------------------------  Chemistries  Recent Labs  Lab 11/26/20 1836  NA 140  K 4.1  CL 106  CO2 24  GLUCOSE 93  BUN 23*  CREATININE 1.67*  CALCIUM 9.5   ------------------------------------------------------------------------------------------------------------------  Cardiac Enzymes No results for input(s): TROPONINI in  the last 168 hours. ------------------------------------------------------------------------------------------------------------------  RADIOLOGY:  DG Chest 2 View  Result Date: 11/26/2020 CLINICAL DATA:  Chest pain EXAM: CHEST - 2 VIEW COMPARISON:  11/15/2020 FINDINGS: The heart size and mediastinal contours are within normal limits. Asymmetric elevation of right hemidiaphragm is again noted. Both lungs are clear. The visualized skeletal structures are unremarkable. IMPRESSION: No active cardiopulmonary disease. Electronically Signed   By: Kerby Moors M.D.   On: 11/26/2020 18:07      IMPRESSION AND PLAN:  Active Problems:   ACS (acute coronary syndrome) ([place])  1.  Acute coronary syndrome likely unstable angina, rule out non-ST elevation MI with elevated  troponin. -The patient will be admitted to an observation progressive cardiac unit bed. -We will follow serial troponin I's. -He will be continued on IV heparin. -We will place him on beta-blocker therapy as well as statin therapy, as needed sublingual nitroglycerin and morphine sulfate for pain. -Cardiology consult will be obtained. -I notified Dr. Acie Fredrickson about the patient.  2.  Hypertensive urgency. -We will continue his antihypertensives and place him on as needed IV labetalol.  3.  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 -We will continue his basal coverage and place him on supplement coverage with NovoLog. -We will continue Jardiance and hold off Metformin. -We will continue his Neurontin.  4.  BPH. -We will continue has Proscar.  5.  Dyslipidemia. -We will continue statin therapy and check fasting lipids in a.m.   DVT prophylaxis: The patient will be on IV heparin. Code Status: full code. Family Communication:  The plan of care was discussed in details with the patient (and family). I answered all questions. The patient agreed to proceed with the above mentioned plan. Further management will depend upon hospital course. Disposition Plan: Back to previous home environment Consults called: Cardiology consult as mentioned above. All the records are reviewed and case discussed with ED provider.  Status is: Observation  The patient remains OBS appropriate and will d/c before 2 midnights.  Dispo: The patient is from: Home              Anticipated d/c is to: Home              Patient currently is not medically stable to d/c.   Difficult to place patient No   TOTAL TIME TAKING CARE OF THIS PATIENT: 55 minutes.    Christel Mormon M.D on 11/26/2020 at 8:18 PM  [facility]   From 7 PM-7 AM, contact night-coverage [email or web address]  CC: Primary care physician; [place], [place], PA-C

## 2020-11-26 NOTE — ED Triage Notes (Signed)
Pt to ED via POV with c/o substernal CP that started 2 weeks ago, pt states was dx with possible costocondritis on Tuesday. Pt A&O x4, ambulatory without difficulty to triage.

## 2020-11-27 ENCOUNTER — Encounter
Admission: EM | Disposition: A | Discharge status: Home / Self Care | Payer: Self-pay | Source: Home / Self Care | Attending: Internal Medicine

## 2020-11-27 ENCOUNTER — Other Ambulatory Visit: Payer: Self-pay

## 2020-11-27 DIAGNOSIS — E1142 Type 2 diabetes mellitus with diabetic polyneuropathy: Secondary | ICD-10-CM | POA: Diagnosis present

## 2020-11-27 DIAGNOSIS — I10 Essential (primary) hypertension: Secondary | ICD-10-CM

## 2020-11-27 DIAGNOSIS — Z888 Allergy status to other drugs, medicaments and biological substances status: Secondary | ICD-10-CM | POA: Diagnosis not present

## 2020-11-27 DIAGNOSIS — Z7982 Long term (current) use of aspirin: Secondary | ICD-10-CM | POA: Diagnosis not present

## 2020-11-27 DIAGNOSIS — I2541 Coronary artery aneurysm: Secondary | ICD-10-CM | POA: Diagnosis present

## 2020-11-27 DIAGNOSIS — Z833 Family history of diabetes mellitus: Secondary | ICD-10-CM | POA: Diagnosis not present

## 2020-11-27 DIAGNOSIS — Z6841 Body Mass Index (BMI) 40.0 and over, adult: Secondary | ICD-10-CM | POA: Diagnosis not present

## 2020-11-27 DIAGNOSIS — Z794 Long term (current) use of insulin: Secondary | ICD-10-CM

## 2020-11-27 DIAGNOSIS — I200 Unstable angina: Secondary | ICD-10-CM | POA: Diagnosis present

## 2020-11-27 DIAGNOSIS — E669 Obesity, unspecified: Secondary | ICD-10-CM | POA: Diagnosis present

## 2020-11-27 DIAGNOSIS — I5022 Chronic systolic (congestive) heart failure: Secondary | ICD-10-CM

## 2020-11-27 DIAGNOSIS — N183 Chronic kidney disease, stage 3 unspecified: Secondary | ICD-10-CM

## 2020-11-27 DIAGNOSIS — G4733 Obstructive sleep apnea (adult) (pediatric): Secondary | ICD-10-CM | POA: Diagnosis present

## 2020-11-27 DIAGNOSIS — I130 Hypertensive heart and chronic kidney disease with heart failure and stage 1 through stage 4 chronic kidney disease, or unspecified chronic kidney disease: Secondary | ICD-10-CM | POA: Diagnosis present

## 2020-11-27 DIAGNOSIS — N1831 Chronic kidney disease, stage 3a: Secondary | ICD-10-CM | POA: Diagnosis present

## 2020-11-27 DIAGNOSIS — N400 Enlarged prostate without lower urinary tract symptoms: Secondary | ICD-10-CM | POA: Diagnosis present

## 2020-11-27 DIAGNOSIS — K219 Gastro-esophageal reflux disease without esophagitis: Secondary | ICD-10-CM | POA: Diagnosis present

## 2020-11-27 DIAGNOSIS — E1122 Type 2 diabetes mellitus with diabetic chronic kidney disease: Secondary | ICD-10-CM | POA: Diagnosis present

## 2020-11-27 DIAGNOSIS — D573 Sickle-cell trait: Secondary | ICD-10-CM | POA: Diagnosis present

## 2020-11-27 DIAGNOSIS — I272 Pulmonary hypertension, unspecified: Secondary | ICD-10-CM | POA: Diagnosis present

## 2020-11-27 DIAGNOSIS — Z79899 Other long term (current) drug therapy: Secondary | ICD-10-CM | POA: Diagnosis not present

## 2020-11-27 DIAGNOSIS — I25110 Atherosclerotic heart disease of native coronary artery with unstable angina pectoris: Secondary | ICD-10-CM | POA: Diagnosis present

## 2020-11-27 DIAGNOSIS — I160 Hypertensive urgency: Secondary | ICD-10-CM | POA: Diagnosis present

## 2020-11-27 DIAGNOSIS — I255 Ischemic cardiomyopathy: Secondary | ICD-10-CM | POA: Diagnosis present

## 2020-11-27 DIAGNOSIS — Z7984 Long term (current) use of oral hypoglycemic drugs: Secondary | ICD-10-CM | POA: Diagnosis not present

## 2020-11-27 DIAGNOSIS — Z20822 Contact with and (suspected) exposure to covid-19: Secondary | ICD-10-CM | POA: Diagnosis present

## 2020-11-27 DIAGNOSIS — E785 Hyperlipidemia, unspecified: Secondary | ICD-10-CM | POA: Diagnosis present

## 2020-11-27 HISTORY — PX: CORONARY STENT INTERVENTION: CATH118234

## 2020-11-27 HISTORY — PX: RIGHT/LEFT HEART CATH AND CORONARY ANGIOGRAPHY: CATH118266

## 2020-11-27 LAB — GLUCOSE, CAPILLARY
Glucose-Capillary: 110 mg/dL — ABNORMAL HIGH (ref 70–99)
Glucose-Capillary: 86 mg/dL (ref 70–99)
Glucose-Capillary: 99 mg/dL (ref 70–99)
Glucose-Capillary: 80 mg/dL (ref 70–99)
Glucose-Capillary: 223 mg/dL — ABNORMAL HIGH (ref 70–99)

## 2020-11-27 LAB — BASIC METABOLIC PANEL
Anion gap: 8 (ref 5–15)
BUN: 22 mg/dL — ABNORMAL HIGH (ref 6–20)
CO2: 26 mmol/L (ref 22–32)
Calcium: 9.3 mg/dL (ref 8.9–10.3)
Chloride: 104 mmol/L (ref 98–111)
Creatinine, Ser: 1.73 mg/dL — ABNORMAL HIGH (ref 0.61–1.24)
GFR, Estimated: 49 mL/min — ABNORMAL LOW (ref >60)
Glucose, Bld: 93 mg/dL (ref 70–99)
Potassium: 3.5 mmol/L (ref 3.5–5.1)
Sodium: 138 mmol/L (ref 135–145)

## 2020-11-27 LAB — CBC
HCT: 43.6 % (ref 39.0–52.0)
Hemoglobin: 15.8 g/dL (ref 13.0–17.0)
MCH: 27.3 pg (ref 26.0–34.0)
MCHC: 36.2 g/dL — ABNORMAL HIGH (ref 30.0–36.0)
MCV: 75.3 fL — ABNORMAL LOW (ref 80.0–100.0)
Platelets: 275 10*3/uL (ref 150–400)
RBC: 5.79 MIL/uL (ref 4.22–5.81)
RDW: 15.2 % (ref 11.5–15.5)
WBC: 8.8 10*3/uL (ref 4.0–10.5)
nRBC: 0 % (ref 0.0–0.2)

## 2020-11-27 LAB — LIPID PANEL
Cholesterol: 176 mg/dL (ref 0–200)
HDL: 44 mg/dL (ref >40)
LDL Cholesterol: 101 mg/dL — ABNORMAL HIGH (ref 0–99)
Total CHOL/HDL Ratio: 4 RATIO
Triglycerides: 154 mg/dL — ABNORMAL HIGH (ref <150)
VLDL: 31 mg/dL (ref 0–40)

## 2020-11-27 LAB — HEPARIN LEVEL (UNFRACTIONATED)
Heparin Unfractionated: 0.41 IU/mL (ref 0.30–0.70)
Heparin Unfractionated: 0.42 IU/mL (ref 0.30–0.70)

## 2020-11-27 LAB — POCT ACTIVATED CLOTTING TIME: Activated Clotting Time: 535 seconds

## 2020-11-27 SURGERY — LEFT HEART CATH AND CORONARY ANGIOGRAPHY
Anesthesia: Moderate Sedation

## 2020-11-27 SURGERY — RIGHT/LEFT HEART CATH AND CORONARY ANGIOGRAPHY
Anesthesia: Moderate Sedation

## 2020-11-27 MED ORDER — SODIUM CHLORIDE 0.9 % IV SOLN: 250.0000 mL | INTRAVENOUS | Status: DC | PRN

## 2020-11-27 MED ORDER — LABETALOL HCL 5 MG/ML IV SOLN: 10.0000 mg | INTRAVENOUS | Status: AC | PRN

## 2020-11-27 MED ORDER — MIDAZOLAM HCL 2 MG/2ML IJ SOLN
INTRAMUSCULAR | Status: DC | PRN
  Administered 2020-11-27: 1 mg via INTRAVENOUS

## 2020-11-27 MED ORDER — SODIUM CHLORIDE 0.9% FLUSH: 3.0000 mL | INTRAVENOUS | Status: DC | PRN

## 2020-11-27 MED ORDER — SODIUM CHLORIDE 0.9 % IV SOLN
INTRAVENOUS | Status: DC | PRN
  Administered 2020-11-27: 1.75 mg/kg/h via INTRAVENOUS

## 2020-11-27 MED ORDER — MIDAZOLAM HCL 2 MG/2ML IJ SOLN
INTRAMUSCULAR | Status: AC
  Filled 2020-11-27: qty 2

## 2020-11-27 MED ORDER — IOHEXOL 300 MG/ML  SOLN
INTRAMUSCULAR | Status: DC | PRN
  Administered 2020-11-27: 160 mL

## 2020-11-27 MED ORDER — FENTANYL CITRATE (PF) 100 MCG/2ML IJ SOLN
INTRAMUSCULAR | Status: DC | PRN
  Administered 2020-11-27: 50 ug via INTRAVENOUS

## 2020-11-27 MED ORDER — BIVALIRUDIN TRIFLUOROACETATE 250 MG IV SOLR
INTRAVENOUS | Status: AC
  Filled 2020-11-27: qty 250

## 2020-11-27 MED ORDER — CLOPIDOGREL BISULFATE 75 MG PO TABS
ORAL_TABLET | ORAL | Status: DC | PRN
  Administered 2020-11-27: 600 mg via ORAL

## 2020-11-27 MED ORDER — CARVEDILOL 12.5 MG PO TABS
12.5000 mg | ORAL_TABLET | Freq: Two times a day (BID) | ORAL | Status: DC
  Administered 2020-11-28: 12.5 mg via ORAL
  Filled 2020-11-27: qty 1

## 2020-11-27 MED ORDER — ASPIRIN 81 MG PO CHEW: 81.0000 mg | CHEWABLE_TABLET | ORAL | Status: DC

## 2020-11-27 MED ORDER — LABETALOL HCL 5 MG/ML IV SOLN
INTRAVENOUS | Status: AC
  Filled 2020-11-27: qty 4

## 2020-11-27 MED ORDER — LABETALOL HCL 5 MG/ML IV SOLN
INTRAVENOUS | Status: DC | PRN
  Administered 2020-11-27 (×2): 10 mg via INTRAVENOUS

## 2020-11-27 MED ORDER — FENTANYL CITRATE (PF) 100 MCG/2ML IJ SOLN
INTRAMUSCULAR | Status: AC
  Filled 2020-11-27: qty 2

## 2020-11-27 MED ORDER — SODIUM CHLORIDE 0.9% FLUSH
3.0000 mL | Freq: Two times a day (BID) | INTRAVENOUS | Status: DC
  Administered 2020-11-27 – 2020-11-28 (×2): 3 mL via INTRAVENOUS

## 2020-11-27 MED ORDER — NITROGLYCERIN 1 MG/10 ML FOR IR/CATH LAB
INTRA_ARTERIAL | Status: AC
  Filled 2020-11-27: qty 10

## 2020-11-27 MED ORDER — ENOXAPARIN SODIUM 80 MG/0.8ML ~~LOC~~ SOLN
0.5000 mg/kg | SUBCUTANEOUS | Status: DC
  Administered 2020-11-28: 72.5 mg via SUBCUTANEOUS
  Filled 2020-11-27: qty 0.8

## 2020-11-27 MED ORDER — CLOPIDOGREL BISULFATE 75 MG PO TABS
75.0000 mg | ORAL_TABLET | Freq: Every day | ORAL | Status: DC
  Administered 2020-11-28: 75 mg via ORAL
  Filled 2020-11-27: qty 1

## 2020-11-27 MED ORDER — VERAPAMIL HCL 2.5 MG/ML IV SOLN
INTRAVENOUS | Status: AC
  Filled 2020-11-27: qty 2

## 2020-11-27 MED ORDER — CLOPIDOGREL BISULFATE 300 MG PO TABS
ORAL_TABLET | ORAL | Status: AC
  Filled 2020-11-27: qty 2

## 2020-11-27 MED ORDER — BIVALIRUDIN BOLUS VIA INFUSION - CUPID
INTRAVENOUS | Status: DC | PRN
  Administered 2020-11-27: 108.9 mg via INTRAVENOUS

## 2020-11-27 MED ORDER — HEPARIN (PORCINE) IN NACL 1000-0.9 UT/500ML-% IV SOLN
INTRAVENOUS | Status: AC
  Filled 2020-11-27: qty 1000

## 2020-11-27 MED ORDER — ASPIRIN EC 81 MG PO TBEC
81.0000 mg | DELAYED_RELEASE_TABLET | Freq: Every day | ORAL | Status: DC
  Administered 2020-11-28: 81 mg via ORAL
  Filled 2020-11-27: qty 1

## 2020-11-27 MED ORDER — HEPARIN SODIUM (PORCINE) 1000 UNIT/ML IJ SOLN
INTRAMUSCULAR | Status: AC
  Filled 2020-11-27: qty 1

## 2020-11-27 MED ORDER — HEPARIN (PORCINE) IN NACL 1000-0.9 UT/500ML-% IV SOLN
INTRAVENOUS | Status: DC | PRN
  Administered 2020-11-27: 1500 mL

## 2020-11-27 MED ORDER — NITROGLYCERIN 1 MG/10 ML FOR IR/CATH LAB
INTRA_ARTERIAL | Status: DC | PRN
  Administered 2020-11-27 (×2): 200 ug

## 2020-11-27 MED ORDER — SODIUM CHLORIDE 0.9 % IV SOLN: INTRAVENOUS | Status: DC

## 2020-11-27 MED ORDER — VERAPAMIL HCL 2.5 MG/ML IV SOLN
INTRAVENOUS | Status: DC | PRN
  Administered 2020-11-27: 2.5 mg via INTRA_ARTERIAL

## 2020-11-27 SURGICAL SUPPLY — 28 items
CATH BALLN WEDGE 5F 110CM (CATHETERS) ×2 IMPLANT
CATH EAGLE EYE PLAT IMAGING (CATHETERS) ×2 IMPLANT
CATH INFINITI 5FR JK (CATHETERS) ×2 IMPLANT
CATH INFINITI 5FR JL4 (CATHETERS) ×2 IMPLANT
CATH INFINITI JR4 5F (CATHETERS) ×2 IMPLANT
CATH VISTA GUIDE 6FR XBLAD4 (CATHETERS) ×2 IMPLANT
DEVICE CLOSURE MYNXGRIP 6/7F (Vascular Products) ×2 IMPLANT
DEVICE RAD TR BAND REGULAR (VASCULAR PRODUCTS) ×2 IMPLANT
DRAPE BRACHIAL (DRAPES) ×4 IMPLANT
GLIDESHEATH SLEND SS 6F .021 (SHEATH) ×2 IMPLANT
GUIDEWIRE .025 260CM (WIRE) ×2 IMPLANT
GUIDEWIRE EMER 3M J .025X150CM (WIRE) ×2 IMPLANT
GUIDEWIRE INQWIRE 1.5J.035X260 (WIRE) ×1 IMPLANT
INQWIRE 1.5J .035X260CM (WIRE) ×2
KIT ENCORE 26 ADVANTAGE (KITS) ×2 IMPLANT
KIT MICROPUNCTURE NIT STIFF (SHEATH) ×2 IMPLANT
PACK CARDIAC CATH (CUSTOM PROCEDURE TRAY) ×2 IMPLANT
PROTECTION STATION PRESSURIZED (MISCELLANEOUS) ×2
SET ATX SIMPLICITY (MISCELLANEOUS) ×2 IMPLANT
SHEATH AVANTI 5FR X 11CM (SHEATH) ×2 IMPLANT
SHEATH AVANTI 6FR X 11CM (SHEATH) ×2 IMPLANT
SHEATH GLIDE SLENDER 4/5FR (SHEATH) ×2 IMPLANT
STATION PROTECTION PRESSURIZED (MISCELLANEOUS) ×1 IMPLANT
STENT RESOLUTE ONYX 3.5X30 (Permanent Stent) ×2 IMPLANT
TUBING CIL FLEX 10 FLL-RA (TUBING) ×2 IMPLANT
WIRE GUIDERIGHT .035X150 (WIRE) ×2 IMPLANT
WIRE HITORQ VERSACORE ST 145CM (WIRE) ×2 IMPLANT
WIRE RUNTHROUGH .014X180CM (WIRE) ×2 IMPLANT

## 2020-11-27 NOTE — Progress Notes (Addendum)
[place] [facility] [place]    MR#:  [id number]  DATE OF BIRTH:  February 17, 1976  SUBJECTIVE:  patient came in with on and off chest pain going on for last several days. Associated with some shortness of breath. Was admitted few days ago in the hospital found to have EF of 30 to 35%. Seen by cardiology. Recommends cardiac cath.  REVIEW OF SYSTEMS:   Review of Systems  Constitutional: Negative for chills, fever and weight loss.  HENT: Negative for ear discharge, ear pain and nosebleeds.   Eyes: Negative for blurred vision, pain and discharge.  Respiratory: Positive for shortness of breath. Negative for sputum production, wheezing and stridor.   Cardiovascular: Positive for chest pain. Negative for palpitations, orthopnea and PND.  Gastrointestinal: Negative for abdominal pain, diarrhea, nausea and vomiting.  Genitourinary: Negative for frequency and urgency.  Musculoskeletal: Negative for back pain and joint pain.  Neurological: Positive for weakness. Negative for sensory change, speech change and focal weakness.  Psychiatric/Behavioral: Negative for depression and hallucinations. The patient is not nervous/anxious.    Tolerating Diet:yes Tolerating PT:   DRUG ALLERGIES:   Allergies  Allergen Reactions  . Lisinopril Other (See Comments)    ALL Medications that end in "PRIL" cause severe coughing  . Molds & Smuts Swelling    ITCHY & BURNING EYES ITCHY & BURNING EYES   . Other Anaphylaxis    Medications that end in "pril" cause severe coughing    VITALS:  Blood pressure (!) 168/99, pulse 88, temperature 98.1 F (36.7 C), temperature source Oral, resp. rate 20, height 6\' 3"  (1.905 m), weight (!) 145.2 kg, SpO2 98 %.  PHYSICAL EXAMINATION:   Physical Exam  GENERAL:  45 y.o.-year-old patient lying in the bed with no acute distress. Obese LUNGS: Normal breath sounds bilaterally, no wheezing, rales, rhonchi. No use of  accessory muscles of respiration.  CARDIOVASCULAR: S1, S2 normal. No murmurs, rubs, or gallops.  ABDOMEN: Soft, nontender, nondistended. Bowel sounds present. No organomegaly or mass.  EXTREMITIES: No cyanosis, clubbing or edema b/l.    NEUROLOGIC: Cranial nerves II through XII are intact. No focal Motor or sensory deficits b/l.   PSYCHIATRIC:  patient is alert and oriented x 3.  SKIN: No obvious rash, lesion, or ulcer.   LABORATORY PANEL:  CBC Recent Labs  Lab 11/27/20 0222  WBC 8.8  HGB 15.8  HCT 43.6  PLT 275    Chemistries  Recent Labs  Lab 11/27/20 0222  NA 138  K 3.5  CL 104  CO2 26  GLUCOSE 93  BUN 22*  CREATININE 1.73*  CALCIUM 9.3   Cardiac Enzymes No results for input(s): TROPONINI in the last 168 hours. RADIOLOGY:  DG Chest 2 View  Result Date: 11/26/2020 CLINICAL DATA:  Chest pain EXAM: CHEST - 2 VIEW COMPARISON:  11/15/2020 FINDINGS: The heart size and mediastinal contours are within normal limits. Asymmetric elevation of right hemidiaphragm is again noted. Both lungs are clear. The visualized skeletal structures are unremarkable. IMPRESSION: No active cardiopulmonary disease. Electronically Signed   By: Kerby Moors M.D.   On: 11/26/2020 18:07   ASSESSMENT AND PLAN:   Ricky Moore is a 45 y.o. African-American male with medical history significant for hypertension, obstructive sleep apnea, type 2 diabetes mellitus, obstructive sleep apnea and GERD, who presented to the emergency room with acute onset of continued intermittent substernal chest pain that started a couple weeks ago.  Unstable angina Ischemic  Cardiomyopathy EF 35-40% multiple risk factors hypertension, obesity, diabetes - follow serial troponin I's-- 60 -pt was started  on IV heparin. -on beta-blocker,statin therapy, as needed sublingual nitroglycerin and morphine sulfate for pain. [facility] Cardiology consult with Dr Fletcher Anon -- patient is status post cardiac cath 90% LAD stenosis status post  stent placement. -cont asa +plavix  Hypertensive urgency.  -continue his antihypertensives and place him on as needed IV labetalol.   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 -ontinue his basal coverage and place him on supplement coverage with NovoLog. -continue Jardiance and hold off Metformin. - on Neurontin.   BPH. - continue Proscar.   Dyslipidemia. -continue statin therapy    Procedures: cardiac cath Family communication :none Consults :[facility] cardiology CODE STATUS: full DVT Prophylaxis :Heparin gtt Level of care: Progressive Cardiac Status is: Inpatient  Remains inpatient appropriate because:Ongoing diagnostic testing needed not appropriate for outpatient work up   Dispo: The patient is from: Home              Anticipated d/c is to: Home              Patient currently is not medically stable to d/c.   Difficult to place patient No  Patient is status post cardiac cath will drug-eluting stent placement. Cardiology recommends overnight observation to ensure he remains hemodynamically stable. Likely discharge to home tomorrow if remains stable      TOTAL TIME TAKING CARE OF THIS PATIENT: 25 minutes.  >50% time spent on counselling and coordination of care  Note: This dictation was prepared with Dragon dictation along with smaller phrase technology. Any transcriptional errors that result from this process are unintentional.  Fritzi Mandes M.D    [facility]   CC: Primary care physician; Belva Bertin, [place], PA-CPatient ID: Ricky Moore, male   DOB: November 26, 1975, 45 y.o.   MRN: [id number]

## 2020-11-27 NOTE — Consult Note (Signed)
[place] for heparin Indication: chest pain/ACS  Allergies  Allergen Reactions  . Lisinopril Other (See Comments)    ALL Medications that end in "PRIL" cause severe coughing  . Molds & Smuts Swelling    ITCHY & BURNING EYES ITCHY & BURNING EYES   . Other Anaphylaxis    Medications that end in "pril" cause severe coughing    Patient Measurements: Height: 6\' 3"  (190.5 cm) Weight: (!) 145.2 kg (320 lb) IBW/kg (Calculated) : 84.5 Heparin Dosing Weight: 117.5 kg  Vital Signs: Temp: 98.6 F (37 C) (03/27 2057) Temp Source: Oral (03/27 2057) BP: 171/120 (03/27 2057) Pulse Rate: 87 (03/27 2057)  Labs: Recent Labs    11/26/20 1836 11/26/20 1949 11/27/20 0222  HGB 15.9  --  15.8  HCT 44.9  --  43.6  PLT 281  --  275  APTT  --  39*  --   LABPROT  --  13.9  --   INR  --  1.1  --   HEPARINUNFRC  --   --  0.41  CREATININE 1.67*  --  1.73*  TROPONINIHS 64* 60*  --     Estimated Creatinine Clearance: 82.1 mL/min (A) (by C-G formula based on SCr of 1.73 mg/dL (H)).   Medical History: Past Medical History:  Diagnosis Date  . Bladder tumor   . Complication of anesthesia    SLIGHT NAUSEA  . Diabetes mellitus without complication ([place])   . GERD (gastroesophageal reflux disease)    RELATED TO CERTAIN FOODS  . Hematuria   . Hypertension   . Sickle cell trait ([place])   . Sleep apnea    USES C PAP    Medications:  No PTA APT or AC  Assessment: 45 y.o. male presents emergency department complaining of continued substernal chest pain that started 2 weeks ago. He was advised to come to the ED and be admitted so that Dr. Fletcher Anon can do a catheterization for Monday. Pharmacy has been consulted for heparin dosing.   Heparin Dosing Weight: 117.5 kg  Goal of Therapy:  Heparin level 0.3-0.7 units/ml Monitor platelets by anticoagulation protocol: Yes   3/28 0222 HL 0.41, therapeutic x 1   Plan:  Continue heparin infusion at 1600  units/hr Recheck HL in 6 hrs to confirm CBC daily while on heparin  Renda Rolls, PharmD, [facility] 11/27/2020 3:01 AM

## 2020-11-27 NOTE — Consult Note (Signed)
Cardiology Consultation:   Patient ID: Ricky Moore MRN: [id number]; DOB: 01/03/1976  Admit date: 11/26/2020 Date of Consult: 11/27/2020  PCP:  [place], [place], [facility]  Cardiologist:  Ida Rogue, MD  Advanced Practice Provider:  No care team member to display Electrophysiologist:  None    Patient Profile:   Ricky Moore is a 45 y.o. male with a hx of: Hypertension, type 2 diabetes, stage III chronic kidney disease and obstructive sleep apnea who is being seen today for the evaluation of chest pain at the request of Dr. Sidney Ace.  History of Present Illness:   Ricky Moore is a 45 year old gentleman gentleman with no previous cardiac history.  He has multiple medical problems as outlined above.  His blood pressure is not controlled and he reports poor compliance to antihypertensive medications.  He was hospitalized recently on the 17th with chest pain and elevated blood pressure.  His chest pain was felt to be atypical overall with a musculoskeletal component.  His blood pressure was 193/122 on presentation.  His troponin was mildly elevated but flat at 65.  EKG showed sinus rhythm with LVH and repolarization abnormalities.  Echocardiogram showed an EF of 35 to 40%.  Given atypical symptoms, medical therapy was recommended.  The patient was discharged home.  However, he called our office with continued substernal chest pain and development of tightness feeling.  He reported that symptoms were getting worse with exertion.  Due to that, it was recommended that he returns back to the ED for admission with plans for cardiac catheterization given continued symptoms, mildly elevated troponin and multiple risk factors and the presence of cardiomyopathy.  He reports mild discomfort at the present time.  He does report worsening symptoms when he walks and exerts himself.   Past Medical History:  Diagnosis Date  . Bladder tumor   . Complication of anesthesia    SLIGHT  NAUSEA  . Diabetes mellitus without complication ([place])   . GERD (gastroesophageal reflux disease)    RELATED TO CERTAIN FOODS  . Hematuria   . Hypertension   . Sickle cell trait ([place])   . Sleep apnea    USES C PAP    Past Surgical History:  Procedure Laterality Date  . CYSTOSCOPY WITH RETROGRADE PYELOGRAM, URETEROSCOPY AND STENT PLACEMENT Bilateral 06/14/2015   Procedure: CYSTOSCOPY WITH BIOPSY, FULGERATION,  BILATERAL RETROGRADE PYELOGRAM;  Surgeon: Alexis Frock, MD;  Location: [facility];  Service: Urology;  Laterality: Bilateral;  . TONSILLECTOMY  2008  . TRANSURETHRAL RESECTION OF BLADDER TUMOR WITH GYRUS (TURBT-GYRUS)  2010  . VASECTOMY Bilateral 06/14/2015   Procedure: VASECTOMY;  Surgeon: Alexis Frock, MD;  Location: [facility];  Service: Urology;  Laterality: Bilateral;     Home Medications:  Prior to Admission medications   Medication Sig Start Date End Date Taking? Authorizing Provider  amLODipine (NORVASC) 10 MG tablet Take 10 mg by mouth daily. 10/18/20   [provider]  aspirin EC 81 MG EC tablet Take 1 tablet (81 mg total) by mouth daily. Swallow whole. 11/17/20   Max Sane, MD  atorvastatin (LIPITOR) 40 MG tablet Take 1 tablet (40 mg total) by mouth daily. 11/17/20 12/17/20  Max Sane, MD  gabapentin (NEURONTIN) 300 MG capsule Take 300 mg by mouth at bedtime.    [provider]  ibuprofen (ADVIL) 600 MG tablet Take 1 tablet (600 mg total) by mouth every 6 (six) hours as needed. 11/16/20   Max Sane, MD  insulin detemir (LEVEMIR)  100 UNIT/ML injection Inject 30 Units into the skin at bedtime.    [provider]  JARDIANCE 25 MG TABS tablet Take 25 mg by mouth daily. 10/18/20   [provider]  labetalol (NORMODYNE) 200 MG tablet Take 1 tablet (200 mg total) by mouth 2 (two) times daily. 11/16/20 12/16/20  Max Sane, MD  losartan (COZAAR) 50 MG tablet Take 50 mg by mouth 2 (two) times daily.    [provider]  Respiratory  Therapy Supplies (FLUTTER) DEVI Use as directed 03/28/16   Tanda Rockers, MD  TRULICITY 3 TI/4.5YK SOPN Inject 3 mg into the skin once a week. 08/17/20   [provider]    Inpatient Medications: Scheduled Meds: . amLODipine  10 mg Oral Daily  . [START ON 11/28/2020] aspirin EC  81 mg Oral Daily  . atorvastatin  40 mg Oral Daily  . carvedilol  12.5 mg Oral BID WC  . empagliflozin  25 mg Oral Daily  . gabapentin  300 mg Oral QHS  . insulin aspart  0-9 Units Subcutaneous TID AC & HS  . insulin detemir  30 Units Subcutaneous QHS  . losartan  50 mg Oral BID  . sodium chloride flush  3 mL Intravenous Q12H   Continuous Infusions: . sodium chloride 75 mL/hr at 11/27/20 0716  . heparin 1,600 Units/hr (11/27/20 1011)   PRN Meds: acetaminophen, ALPRAZolam, morphine injection, nitroGLYCERIN, ondansetron (ZOFRAN) IV, zolpidem  Allergies:    Allergies  Allergen Reactions  . Lisinopril Other (See Comments)    ALL Medications that end in "PRIL" cause severe coughing  . Molds & Smuts Swelling    ITCHY & BURNING EYES ITCHY & BURNING EYES   . Other Anaphylaxis    Medications that end in "pril" cause severe coughing    Social History:   Social History   Socioeconomic History  . Marital status: Married    Spouse name: Not on file  . Number of children: Not on file  . Years of education: Not on file  . Highest education level: Not on file  Occupational History  . Not on file  Tobacco Use  . Smoking status: Never Smoker  . Smokeless tobacco: Never Used  Substance and Sexual Activity  . Alcohol use: Yes    Alcohol/week: 0.0 standard drinks    Comment: OCCASIONAL  . Drug use: No  . Sexual activity: Not on file  Other Topics Concern  . Not on file  Social History Narrative   ** Merged History Encounter **       Social Determinants of Health   Financial Resource Strain: Not on file  Food Insecurity: Not on file  Transportation Needs: Not on file  Physical Activity:  Not on file  Stress: Not on file  Social Connections: Not on file  Intimate Partner Violence: Not on file    Family History:    Family History  Problem Relation Age of Onset  . Diabetes Mellitus II Maternal Aunt      ROS:  Please see the history of present illness.   All other ROS reviewed and negative.     Physical Exam/Data:   Vitals:   11/26/20 1953 11/26/20 2057 11/27/20 0409 11/27/20 0753  BP: (!) 163/95 (!) 171/120 139/81 (!) 154/108  Pulse: 78 87 84 81  Resp: (!) 23  18 18   Temp:  98.6 F (37 C) 97.7 F (36.5 C) 98.5 F (36.9 C)  TempSrc:  Oral Oral Oral  SpO2: 98% 100%  100% 100%  Weight:      Height:        Intake/Output Summary (Last 24 hours) at 11/27/2020 1145 Last data filed at 11/27/2020 0525 Gross per 24 hour  Intake 903.85 ml  Output --  Net 903.85 ml   Last 3 Weights 11/26/2020 11/15/2020 11/15/2020  Weight (lbs) 320 lb 321 lb 12.8 oz 325 lb  Weight (kg) 145.151 kg 145.968 kg 147.419 kg     Body mass index is 40 kg/m.  General:  Well nourished, well developed, in no acute distress HEENT: normal Lymph: no adenopathy Neck: no JVD Endocrine:  No thryomegaly Vascular: No carotid bruits; FA pulses 2+ bilaterally without bruits  Cardiac:  normal S1, S2; RRR; no murmur  Lungs:  clear to auscultation bilaterally, no wheezing, rhonchi or rales  Abd: soft, nontender, no hepatomegaly  Ext: no edema Musculoskeletal:  No deformities, BUE and BLE strength normal and equal Skin: warm and dry  Neuro:  CNs 2-12 intact, no focal abnormalities noted Psych:  Normal affect   EKG:  The EKG was personally reviewed and demonstrates: Sinus rhythm with LVH with repolarization abnormalities and anterior T wave changes.  Poor R wave progression in the anterior leads. Telemetry:  Telemetry was personally reviewed and demonstrates: Sinus rhythm  Relevant CV Studies: Echocardiogram was done on March 17 of this year which showed an EF of 35 to 40% with global hypokinesis,  mild left ventricular hypertrophy and mild mitral regurgitation.  Laboratory Data:  High Sensitivity Troponin:   Recent Labs  Lab 11/15/20 1918 11/15/20 2245 11/16/20 0025 11/26/20 1836 11/26/20 1949  TROPONINIHS 66* 66* 65* 64* 60*     Chemistry Recent Labs  Lab 11/26/20 1836 11/27/20 0222  NA 140 138  K 4.1 3.5  CL 106 104  CO2 24 26  GLUCOSE 93 93  BUN 23* 22*  CREATININE 1.67* 1.73*  CALCIUM 9.5 9.3  GFRNONAA 51* 49*  ANIONGAP 10 8    No results for input(s): PROT, ALBUMIN, AST, ALT, ALKPHOS, BILITOT in the last 168 hours. Hematology Recent Labs  Lab 11/26/20 1836 11/27/20 0222  WBC 7.2 8.8  RBC 5.90* 5.79  HGB 15.9 15.8  HCT 44.9 43.6  MCV 76.1* 75.3*  MCH 26.9 27.3  MCHC 35.4 36.2*  RDW 15.1 15.2  PLT 281 275   BNPNo results for input(s): BNP, PROBNP in the last 168 hours.  DDimer No results for input(s): DDIMER in the last 168 hours.   Radiology/Studies:  DG Chest 2 View  Result Date: 11/26/2020 CLINICAL DATA:  Chest pain EXAM: CHEST - 2 VIEW COMPARISON:  11/15/2020 FINDINGS: The heart size and mediastinal contours are within normal limits. Asymmetric elevation of right hemidiaphragm is again noted. Both lungs are clear. The visualized skeletal structures are unremarkable. IMPRESSION: No active cardiopulmonary disease. Electronically Signed   By: Kerby Moors M.D.   On: 11/26/2020 18:07     Assessment and Plan:   1. Unstable angina: The patient symptoms are worrisome for unstable angina with multiple risk factors for coronary artery disease as well as associated cardiomyopathy.  Given his persistent symptoms, right and left cardiac catheterization was recommended.  I discussed the procedure in details as well as risk and benefits.  I discussed the risk of contrast-induced nephropathy and will try to minimize contrast load.  Further recommendations to follow after cardiac cath. 2. Chronic systolic heart failure: Suspect hypertensive heart disease-in  the setting of uncontrolled hypertension.  This will be evaluated with right heart  catheterization as outlined above in order to fully evaluate his volume status.  Continue losartan.  I elected to switch labetalol to carvedilol. 3. Essential hypertension: Blood pressure is not controlled.  Continue amlodipine and losartan.  I switch labetalol to carvedilol.  If renal function remains stable, we could consider adding spironolactone or a combination of Imdur/hydralazine. 4. Stage III chronic kidney disease: Monitor kidney function closely especially with contrast use.    For questions or updates, please contact [place] Please consult [email or web address] for contact info under    Signed, Kathlyn Sacramento, MD  11/27/2020 11:45 AM

## 2020-11-27 NOTE — Consult Note (Signed)
[place] for heparin Indication: chest pain/ACS  Allergies  Allergen Reactions  . Lisinopril Other (See Comments)    ALL Medications that end in "PRIL" cause severe coughing  . Molds & Smuts Swelling    ITCHY & BURNING EYES ITCHY & BURNING EYES   . Other Anaphylaxis    Medications that end in "pril" cause severe coughing    Patient Measurements: Height: 6\' 3"  (190.5 cm) Weight: (!) 145.2 kg (320 lb) IBW/kg (Calculated) : 84.5 Heparin Dosing Weight: 117.5 kg  Vital Signs: Temp: 98.5 F (36.9 C) (03/28 0753) Temp Source: Oral (03/28 0753) BP: 154/108 (03/28 0753) Pulse Rate: 81 (03/28 0753)  Labs: Recent Labs    11/26/20 1836 11/26/20 1949 11/27/20 0222 11/27/20 0824  HGB 15.9  --  15.8  --   HCT 44.9  --  43.6  --   PLT 281  --  275  --   APTT  --  39*  --   --   LABPROT  --  13.9  --   --   INR  --  1.1  --   --   HEPARINUNFRC  --   --  0.41 0.42  CREATININE 1.67*  --  1.73*  --   TROPONINIHS 64* 60*  --   --     Estimated Creatinine Clearance: 82.1 mL/min (A) (by C-G formula based on SCr of 1.73 mg/dL (H)).   Medical History: Past Medical History:  Diagnosis Date  . Bladder tumor   . Complication of anesthesia    SLIGHT NAUSEA  . Diabetes mellitus without complication ([place])   . GERD (gastroesophageal reflux disease)    RELATED TO CERTAIN FOODS  . Hematuria   . Hypertension   . Sickle cell trait ([place])   . Sleep apnea    USES C PAP    Medications:  No PTA APT or AC  Assessment: 45 y.o. male presents emergency department complaining of continued substernal chest pain that started 2 weeks ago. He was advised to come to the ED and be admitted so that Dr. Fletcher Anon can do a catheterization for Monday. Pharmacy has been consulted for heparin dosing.   Heparin Dosing Weight: 117.5 kg  Goal of Therapy:  Heparin level 0.3-0.7 units/ml Monitor platelets by anticoagulation protocol: Yes   3/28 0222 HL 0.41,  therapeutic x 1  3/28 0824 HL 0.42    Plan:  Heparin level is therapeutic. Will continue heparin infusion at 1600 units/hr. Recheck HL and CBC with AM labs.   Eleonore Chiquito, PharmD.  11/27/2020 9:51 AM

## 2020-11-27 NOTE — Progress Notes (Signed)
Patient resting in bed throughout the morning comfortably. No requests nor complaints. Patient aware of catheretization  and has spoken to [place].

## 2020-11-28 ENCOUNTER — Other Ambulatory Visit: Payer: Self-pay

## 2020-11-28 ENCOUNTER — Encounter: Payer: Self-pay | Admitting: Cardiovascular Disease

## 2020-11-28 ENCOUNTER — Telehealth: Payer: Self-pay | Admitting: Cardiovascular Disease

## 2020-11-28 DIAGNOSIS — I25110 Atherosclerotic heart disease of native coronary artery with unstable angina pectoris: Secondary | ICD-10-CM

## 2020-11-28 DIAGNOSIS — I200 Unstable angina: Secondary | ICD-10-CM | POA: Diagnosis not present

## 2020-11-28 DIAGNOSIS — I255 Ischemic cardiomyopathy: Secondary | ICD-10-CM | POA: Diagnosis not present

## 2020-11-28 DIAGNOSIS — E1142 Type 2 diabetes mellitus with diabetic polyneuropathy: Secondary | ICD-10-CM | POA: Diagnosis not present

## 2020-11-28 LAB — GLUCOSE, CAPILLARY: Glucose-Capillary: 161 mg/dL — ABNORMAL HIGH (ref 70–99)

## 2020-11-28 LAB — CBC
HCT: 42.7 % (ref 39.0–52.0)
Hemoglobin: 15.2 g/dL (ref 13.0–17.0)
MCH: 27 pg (ref 26.0–34.0)
MCHC: 35.6 g/dL (ref 30.0–36.0)
MCV: 75.8 fL — ABNORMAL LOW (ref 80.0–100.0)
Platelets: 243 10*3/uL (ref 150–400)
RBC: 5.63 MIL/uL (ref 4.22–5.81)
RDW: 15.2 % (ref 11.5–15.5)
WBC: 6.6 10*3/uL (ref 4.0–10.5)
nRBC: 0 % (ref 0.0–0.2)

## 2020-11-28 LAB — BASIC METABOLIC PANEL
Anion gap: 8 (ref 5–15)
BUN: 20 mg/dL (ref 6–20)
CO2: 24 mmol/L (ref 22–32)
Calcium: 9.1 mg/dL (ref 8.9–10.3)
Chloride: 105 mmol/L (ref 98–111)
Creatinine, Ser: 1.81 mg/dL — ABNORMAL HIGH (ref 0.61–1.24)
GFR, Estimated: 46 mL/min — ABNORMAL LOW (ref >60)
Glucose, Bld: 96 mg/dL (ref 70–99)
Potassium: 3.8 mmol/L (ref 3.5–5.1)
Sodium: 137 mmol/L (ref 135–145)

## 2020-11-28 MED ORDER — CARVEDILOL 12.5 MG PO TABS: 12.5000 mg | ORAL_TABLET | Freq: Two times a day (BID) | ORAL | 3 refills | Status: DC

## 2020-11-28 MED ORDER — NITROGLYCERIN 0.4 MG SL SUBL: 0.4000 mg | SUBLINGUAL_TABLET | SUBLINGUAL | 12 refills | Status: AC | PRN

## 2020-11-28 MED ORDER — ASPIRIN 81 MG PO TBEC: 81.0000 mg | DELAYED_RELEASE_TABLET | Freq: Every day | ORAL | 10 refills | Status: DC

## 2020-11-28 MED ORDER — CLOPIDOGREL BISULFATE 75 MG PO TABS: 75.0000 mg | ORAL_TABLET | Freq: Every day | ORAL | 3 refills | Status: DC

## 2020-11-28 NOTE — Discharge Summary (Signed)
[facility] NAME: Ricky Moore    MR#:  [id number]  DATE OF BIRTH:  07-28-1976  DATE OF ADMISSION:  11/26/2020 ADMITTING PHYSICIAN: Christel Mormon, MD  DATE OF DISCHARGE: 11/28/2020  PRIMARY CARE PHYSICIAN: [place], [place], PA-C    ADMISSION DIAGNOSIS:  Unstable angina ([place]) [I20.0] ACS (acute coronary syndrome) ([place]) [I24.9]  DISCHARGE DIAGNOSIS:  Unstable Angina CAD s/p DES in LAD  SECONDARY DIAGNOSIS:   Past Medical History:  Diagnosis Date  . Bladder tumor   . Complication of anesthesia    SLIGHT NAUSEA  . Diabetes mellitus without complication ([place])   . GERD (gastroesophageal reflux disease)    RELATED TO CERTAIN FOODS  . Hematuria   . Hypertension   . Sickle cell trait ([place])   . Sleep apnea    USES C PAP    HOSPITAL COURSE:   Sukhraj Esquivias a 45 y.o.African-American malewith medical history significant forhypertension, obstructive sleep apnea, type 2 diabetes mellitus, obstructive sleep apneaandGERD, who presented to the emergency room with acute onset of continued intermittent substernal chest pain that started a couple weeks ago.  Unstable angina Ischemic Cardiomyopathy EF 35-40% multiple risk factors hypertension, obesity, diabetes -pt was started  on IV heparin prior to cath -on beta-blocker,statin therapy, as needed sublingual nitroglycerin , losartan and amlodipine [facility] Cardiology consult with Dr Fletcher Anon -- patient is status post cardiac cath 90% LAD stenosis status post stent placement. -cont asa +plavix -cardiac rehab  Hypertensive urgency. -BP improved --cont above Bp meds  Type 2diabetes mellitus with stage IIIa chronic kidney disease and peripheral neuropathy. -ontinue his basal coverage and place him on supplement coverage with NovoLog. -continue Jardiance and hold off Metformin. - on Neurontin.  BPH. - continue Proscar.  Dyslipidemia. -On statin therapy     Procedures: cardiac cath Family communication :none Consults :[facility] cardiology CODE STATUS: full DVT Prophylaxis :lovenox Level of care: Progressive Cardiac Status is: Inpatient  Dispo: The patient is from: Home  Anticipated d/c is to: Home today  Patient currently is medically stable to d/c.              Difficult to place patient No  Ok from cardiology standpoint for d/c Pt will follow up [facility] cards in 7-10 days  CONSULTS OBTAINED:  Treatment Team:  Wellington [place], MD  DRUG ALLERGIES:   Allergies  Allergen Reactions  . Lisinopril Other (See Comments)    ALL Medications that end in "PRIL" cause severe coughing  . Molds & Smuts Swelling    ITCHY & BURNING EYES ITCHY & BURNING EYES   . Other Anaphylaxis    Medications that end in "pril" cause severe coughing    DISCHARGE MEDICATIONS:   Allergies as of 11/28/2020      Reactions   Lisinopril Other (See Comments)   ALL Medications that end in "PRIL" cause severe coughing   Molds & Smuts Swelling   ITCHY & BURNING EYES ITCHY & BURNING EYES   Other Anaphylaxis   Medications that end in "pril" cause severe coughing      Medication List    STOP taking these medications   ibuprofen 600 MG tablet Commonly known as: ADVIL   labetalol 200 MG tablet Commonly known as: NORMODYNE     TAKE these medications   amLODipine 10 MG tablet Commonly known as: NORVASC Take 10 mg by mouth daily.   aspirin 81 MG EC tablet Take 1 tablet (81 mg total) by mouth  daily. Swallow whole.   atorvastatin 40 MG tablet Commonly known as: LIPITOR Take 1 tablet (40 mg total) by mouth daily.   carvedilol 12.5 MG tablet Commonly known as: COREG Take 1 tablet (12.5 mg total) by mouth 2 (two) times daily with a meal.   clopidogrel 75 MG tablet Commonly known as: PLAVIX Take 1 tablet (75 mg total) by mouth daily with breakfast.   Flutter Devi Use as directed   gabapentin 300 MG capsule Commonly  known as: NEURONTIN Take 300 mg by mouth at bedtime.   insulin detemir 100 UNIT/ML injection Commonly known as: LEVEMIR Inject 30 Units into the skin at bedtime.   Jardiance 25 MG Tabs tablet Generic drug: empagliflozin Take 25 mg by mouth daily.   losartan 50 MG tablet Commonly known as: COZAAR Take 50 mg by mouth 2 (two) times daily.   nitroGLYCERIN 0.4 MG SL tablet Commonly known as: NITROSTAT Place 1 tablet (0.4 mg total) under the tongue every 5 (five) minutes x 3 doses as needed for chest pain.   Trulicity 3 HQ/4.6NG Sopn Generic drug: Dulaglutide Inject 3 mg into the skin once a week.       If you experience worsening of your admission symptoms, develop shortness of breath, life threatening emergency, suicidal or homicidal thoughts you must seek medical attention immediately by calling 911 or calling your MD immediately  if symptoms less severe.  You Must read complete instructions/literature along with all the possible adverse reactions/side effects for all the Medicines you take and that have been prescribed to you. Take any new Medicines after you have completely understood and accept all the possible adverse reactions/side effects.   Please note  You were cared for by a hospitalist during your hospital stay. If you have any questions about your discharge medications or the care you received while you were in the hospital after you are discharged, you can call the unit and asked to speak with the hospitalist on call if the hospitalist that took care of you is not available. Once you are discharged, your primary care physician will handle any further medical issues. Please note that NO REFILLS for any discharge medications will be authorized once you are discharged, as it is imperative that you return to your primary care physician (or establish a relationship with a primary care physician if you do not have one) for your aftercare needs so that they can reassess your need  for medications and monitor your lab values. Today   SUBJECTIVE  No new complaints   VITAL SIGNS:  Blood pressure (!) 144/88, pulse 75, temperature 98.2 F (36.8 C), resp. rate 18, height 6\' 3"  (1.905 m), weight (!) 145.9 kg, SpO2 100 %.  I/O:    Intake/Output Summary (Last 24 hours) at 11/28/2020 0848 Last data filed at 11/27/2020 1856 Gross per 24 hour  Intake 1650.36 ml  Output 2050 ml  Net -399.64 ml    PHYSICAL EXAMINATION:  GENERAL:  45 y.o.-year-old patient lying in the bed with no acute distress.  LUNGS: Normal breath sounds bilaterally, no wheezing, rales,rhonchi or crepitation. No use of accessory muscles of respiration.  CARDIOVASCULAR: S1, S2 normal. No murmurs, rubs, or gallops.  ABDOMEN: Soft, non-tender, non-distended. Bowel sounds present. No organomegaly or mass.  EXTREMITIES: No pedal edema, cyanosis, or clubbing.  NEUROLOGIC: Cranial nerves II through XII are intact. Muscle strength 5/5 in all extremities. Sensation intact. Gait not checked.  PSYCHIATRIC: The patient is alert and oriented x 3.  SKIN: No  obvious rash, lesion, or ulcer.   DATA REVIEW:   CBC  Recent Labs  Lab 11/28/20 0553  WBC 6.6  HGB 15.2  HCT 42.7  PLT 243    Chemistries  Recent Labs  Lab 11/28/20 0553  NA 137  K 3.8  CL 105  CO2 24  GLUCOSE 96  BUN 20  CREATININE 1.81*  CALCIUM 9.1    Microbiology Results   Recent Results (from the past 240 hour(s))  Resp Panel by RT-PCR (Flu A&B, Covid) Nasopharyngeal Swab     Status: None   Collection Time: 11/26/20  8:04 PM   Specimen: Nasopharyngeal Swab; Nasopharyngeal(NP) swabs in vial transport medium  Result Value Ref Range Status   SARS Coronavirus 2 by RT PCR NEGATIVE NEGATIVE Final    Comment: (NOTE) SARS-CoV-2 target nucleic acids are NOT DETECTED.  The SARS-CoV-2 RNA is generally detectable in upper respiratory specimens during the acute phase of infection. The lowest concentration of SARS-CoV-2 viral copies this  assay can detect is 138 copies/mL. A negative result does not preclude SARS-Cov-2 infection and should not be used as the sole basis for treatment or other patient management decisions. A negative result may occur with  improper specimen collection/handling, submission of specimen other than nasopharyngeal swab, presence of viral mutation(s) within the areas targeted by this assay, and inadequate number of viral copies(<138 copies/mL). A negative result must be combined with clinical observations, patient history, and epidemiological information. The expected result is Negative.  Fact Sheet for Patients:  EntrepreneurPulse.com.au  Fact Sheet for Healthcare Providers:  IncredibleEmployment.be  This test is no t yet approved or cleared by the [place] FDA and  has been authorized for detection and/or diagnosis of SARS-CoV-2 by FDA under an Emergency Use Authorization (EUA). This EUA will remain  in effect (meaning this test can be used) for the duration of the COVID-19 declaration under Section 564(b)(1) of the Act, 21 U.S.C.section 360bbb-3(b)(1), unless the authorization is terminated  or revoked sooner.       Influenza A by PCR NEGATIVE NEGATIVE Final   Influenza B by PCR NEGATIVE NEGATIVE Final    Comment: (NOTE) The Xpert Xpress SARS-CoV-2/FLU/RSV plus assay is intended as an aid in the diagnosis of influenza from Nasopharyngeal swab specimens and should not be used as a sole basis for treatment. Nasal washings and aspirates are unacceptable for Xpert Xpress SARS-CoV-2/FLU/RSV testing.  Fact Sheet for Patients: EntrepreneurPulse.com.au  Fact Sheet for Healthcare Providers: IncredibleEmployment.be  This test is not yet approved or cleared by the [place] FDA and has been authorized for detection and/or diagnosis of SARS-CoV-2 by FDA under an Emergency Use Authorization (EUA). This EUA will  remain in effect (meaning this test can be used) for the duration of the COVID-19 declaration under Section 564(b)(1) of the Act, 21 U.S.C. section 360bbb-3(b)(1), unless the authorization is terminated or revoked.  Performed at [facility], [address] [place], [place] [address]     RADIOLOGY:  DG Chest 2 View  Result Date: 11/26/2020 CLINICAL DATA:  Chest pain EXAM: CHEST - 2 VIEW COMPARISON:  11/15/2020 FINDINGS: The heart size and mediastinal contours are within normal limits. Asymmetric elevation of right hemidiaphragm is again noted. Both lungs are clear. The visualized skeletal structures are unremarkable. IMPRESSION: No active cardiopulmonary disease. Electronically Signed   By: Kerby Moors M.D.   On: 11/26/2020 18:07   CARDIAC CATHETERIZATION  Result Date: 11/27/2020  RPDA lesion is 30% stenosed.  2nd RPL lesion is 100%  stenosed.  Mid LAD lesion is 90% stenosed.  Post intervention, there is a 0% residual stenosis.  A drug-eluting stent was successfully placed using a STENT RESOLUTE ONYX 3.5X30.  1st Mrg lesion is 100% stenosed.  1.  Severe one-vessel coronary artery disease with 90% stenosis in the mid LAD.In addition, the patient has small vessel disease affecting RPL 2 and OM1.  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 2.  Right heart catheterization showed mildly elevated filling pressures with pulmonary capillary wedge pressure of 17 mmHg, mild pulmonary hypertension and normal cardiac output. 3.  Successful IVUS and drug-eluting stent placement to the mid LAD. Recommendations: Dual antiplatelet therapy for at least 6 months. Aggressive treatment of risk factors and blood pressure control. I switch labetalol to carvedilol earlier today. The patient most likely might require a diuretic given elevated PCW/ LVEDP but will have to make sure his renal function remains stable. A total of 160 mL of contrast was used.     CODE  STATUS:     Code Status Orders  (From admission, onward)         Start     Ordered   11/26/20 2013  Full code  Continuous        11/26/20 2016        Code Status History    Date Active Date Inactive Code Status Order ID Comments User Context   11/15/2020 2210 11/16/2020 1653 Full Code [id number]  Athena Masse, MD ED   07/14/2014 1958 07/16/2014 1636 Full Code [id number]  Berle Mull, MD Inpatient   Advance Care Planning Activity       TOTAL TIME TAKING CARE OF THIS PATIENT: *40 minutes.    Fritzi Mandes M.D  [facility]    CC: Primary care physician; [place], [place], [place]

## 2020-11-28 NOTE — Telephone Encounter (Signed)
-----   Message from Arvil Chaco, PA-C sent at 11/28/2020  9:14 AM EDT ----- Regarding: TCM Hello,  This patient was admitted and seen at [facility] and underwent R/LHC.  We are expecting discharge today.  (1) Can you please call and arrange / schedule for follow-up TCM appointment with Dr. Rockey Situ or APP within the next two weeks? (2) He will need a BMET at or before his next visit.  Thank you! Signed, Arvil Chaco, PA-C 11/28/2020, 9:14 AM Pager [phone]

## 2020-11-28 NOTE — Telephone Encounter (Signed)
TCM....  Patient is being discharged   They saw Rockey Situ  They are scheduled to see Elease Etienne 12/08/20  They were seen for [facility]  They need to be seen within 2 weeks  Pt not placed on wait list   Please call

## 2020-11-28 NOTE — Discharge Instructions (Signed)
Keep log of sugars for PCP to review Cardiac rehab advised      You have received care from [facility] during this hospital stay and we look forward to continuing to provide you with excellent care in our office settings after you've left the hospital.  In order to assure a smoother transition to home following your discharge from the hospital, we will likely have you see one of our nurse practitioners or physician assistants within a few weeks of discharge.  Our advanced practice providers work closely with your physician in order to address all of your heart's needs in a timely manner.  More information about all of our providers may be found here: [email or web address]   Please plan to bring all of your prescriptions to your follow-up appointment and don't hesitate to contact us with questions or concerns.  [place] [place], [place] [address]  Radial Site Care Refer to this sheet in the next few weeks. These instructions provide you with information on caring for yourself after your procedure. Your caregiver may also give you more specific instructions. Your treatment has been planned according to current medical practices, but problems sometimes occur. Call your caregiver if you have any problems or questions after your procedure. HOME CARE INSTRUCTIONS  You may shower the day after the procedure. Remove the bandage (dressing) and gently wash the site with plain soap and water. Gently pat the site dry.   Do not apply powder or lotion to the site.   Do not submerge the affected site in water for 3 to 5 days.   Inspect the site at least twice daily.   Do not flex or bend the affected arm for 24 hours.   No lifting over 5 pounds (2.3 kg) for 5 days after your procedure.  What to expect:  Any bruising will usually fade within 1 to 2 weeks.    Blood that collects in the tissue (hematoma) may be painful to the touch. It should usually decrease in size and tenderness within 1 to 2 weeks.  SEEK IMMEDIATE MEDICAL CARE IF:  You have unusual pain at the radial site.   You have redness, warmth, swelling, or pain at the radial site.   You have drainage (other than a small amount of blood on the dressing).   You have chills.   You have a fever or persistent symptoms for more than 72 hours.   You have a fever and your symptoms suddenly get worse.   Your arm becomes pale, cool, tingly, or numb.   You have heavy bleeding from the site. Hold pressure on the site.      10 Habits of Highly Healthy [place] wants to help you get well and stay well.  Live a longer, healthier life by practicing healthy habits every day.  1.  Visit your primary care provider regularly. 2.  Make time for family and friends.  Healthy relationships are important. 3.  Take medications as directed by your provider. 4.  Maintain a healthy weight and a trim waistline. 5.  Eat healthy meals and snacks, rich in fruits, vegetables, whole grains, and lean proteins. 6.  Get moving every day - aim for 150 minutes of moderate physical activity each week. 7.  Don't smoke. 8.  Avoid alcohol or drink in moderation. 9.  Manage stress through meditation or mindful relaxation. 10.  Get seven to nine hours of quality sleep  each night.  Want more information on healthy habits?  To learn more about these and other healthy habits, visit SecuritiesCard.it.  1. Follow a low-salt diet - you are allowed no more than 2,000mg  of sodium per day. Watch your fluid intake. In general, you should not be taking in more than 2 liters of fluid per day (no more than 8 glasses per day). This includes sources of water in foods like soup, coffee, tea, milk, etc. 2. Weigh yourself on the same scale at same time of day and keep a log. 3. Call your doctor: (Anytime you feel  any of the following symptoms)  - 3lb weight gain overnight or 5lb within a few days - Shortness of breath, with or without a dry hacking cough  - Swelling in the hands, feet or stomach  - If you have to sleep on extra pillows at night in order to breathe   IT IS IMPORTANT TO LET YOUR DOCTOR KNOW EARLY ON IF YOU ARE HAVING SYMPTOMS SO WE CAN HELP YOU!

## 2020-11-28 NOTE — Progress Notes (Signed)
Progress Note  Patient Name: Ricky Moore Date of Encounter: 11/28/2020  [facility] Cardiologist: Ida Rogue, MD   Subjective   He feels significantly better with minimal chest pain. BP is more controlled.   Inpatient Medications    Scheduled Meds: . amLODipine  10 mg Oral Daily  . aspirin EC  81 mg Oral Daily  . atorvastatin  40 mg Oral Daily  . carvedilol  12.5 mg Oral BID WC  . clopidogrel  75 mg Oral Q breakfast  . empagliflozin  25 mg Oral Daily  . enoxaparin (LOVENOX) injection  0.5 mg/kg Subcutaneous Q24H  . insulin aspart  0-9 Units Subcutaneous TID AC & HS  . insulin detemir  30 Units Subcutaneous QHS  . losartan  50 mg Oral BID  . sodium chloride flush  3 mL Intravenous Q12H  . sodium chloride flush  3 mL Intravenous Q12H   Continuous Infusions: . sodium chloride     PRN Meds: sodium chloride, acetaminophen, ALPRAZolam, morphine injection, nitroGLYCERIN, ondansetron (ZOFRAN) IV, sodium chloride flush, zolpidem   Vital Signs    Vitals:   11/27/20 2058 11/28/20 0442 11/28/20 0500 11/28/20 0905  BP: (!) 151/94 (!) 144/88  (!) 143/75  Pulse: 87 75  91  Resp: 18 18  16   Temp: 98.3 F (36.8 C) 98.2 F (36.8 C)    TempSrc: Oral     SpO2: 99% 100%  98%  Weight:   (!) 145.9 kg   Height:        Intake/Output Summary (Last 24 hours) at 11/28/2020 0906 Last data filed at 11/27/2020 1856 Gross per 24 hour  Intake 1650.36 ml  Output 2050 ml  Net -399.64 ml   Last 3 Weights 11/28/2020 11/27/2020 11/26/2020  Weight (lbs) 321 lb 9.6 oz 320 lb 1.7 oz 320 lb  Weight (kg) 145.877 kg 145.2 kg 145.151 kg      Telemetry    NSR - Personally Reviewed  ECG    NSR with LVH - Personally Reviewed  Physical Exam   GEN: No acute distress.   Neck: No JVD Cardiac: RRR, no murmurs, rubs, or gallops.  Respiratory: Clear to auscultation bilaterally. GI: Soft, nontender, non-distended  MS: No edema; No deformity. Neuro:  Nonfocal  Psych: Normal affect  Right  radial pulse is normal with no hematoma. No groin hematoma.   Labs    High Sensitivity Troponin:   Recent Labs  Lab 11/15/20 1918 11/15/20 2245 11/16/20 0025 11/26/20 1836 11/26/20 1949  TROPONINIHS 66* 66* 65* 64* 60*      Chemistry Recent Labs  Lab 11/26/20 1836 11/27/20 0222 11/28/20 0553  NA 140 138 137  K 4.1 3.5 3.8  CL 106 104 105  CO2 24 26 24   GLUCOSE 93 93 96  BUN 23* 22* 20  CREATININE 1.67* 1.73* 1.81*  CALCIUM 9.5 9.3 9.1  GFRNONAA 51* 49* 46*  ANIONGAP 10 8 8      Hematology Recent Labs  Lab 11/26/20 1836 11/27/20 0222 11/28/20 0553  WBC 7.2 8.8 6.6  RBC 5.90* 5.79 5.63  HGB 15.9 15.8 15.2  HCT 44.9 43.6 42.7  MCV 76.1* 75.3* 75.8*  MCH 26.9 27.3 27.0  MCHC 35.4 36.2* 35.6  RDW 15.1 15.2 15.2  PLT 281 275 243    BNPNo results for input(s): BNP, PROBNP in the last 168 hours.   DDimer No results for input(s): DDIMER in the last 168 hours.   Radiology    DG Chest 2 View  Result Date:  11/26/2020 CLINICAL DATA:  Chest pain EXAM: CHEST - 2 VIEW COMPARISON:  11/15/2020 FINDINGS: The heart size and mediastinal contours are within normal limits. Asymmetric elevation of right hemidiaphragm is again noted. Both lungs are clear. The visualized skeletal structures are unremarkable. IMPRESSION: No active cardiopulmonary disease. Electronically Signed   By: Kerby Moors M.D.   On: 11/26/2020 18:07   CARDIAC CATHETERIZATION  Result Date: 11/27/2020  RPDA lesion is 30% stenosed.  2nd RPL lesion is 100% stenosed.  Mid LAD lesion is 90% stenosed.  Post intervention, there is a 0% residual stenosis.  A drug-eluting stent was successfully placed using a STENT RESOLUTE ONYX 3.5X30.  1st Mrg lesion is 100% stenosed.  1.  Severe one-vessel coronary artery disease with 90% stenosis in the mid LAD.In addition, the patient has small vessel disease affecting RPL 2 and OM1.  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 2.  Right  heart catheterization showed mildly elevated filling pressures with pulmonary capillary wedge pressure of 17 mmHg, mild pulmonary hypertension and normal cardiac output. 3.  Successful IVUS and drug-eluting stent placement to the mid LAD. Recommendations: Dual antiplatelet therapy for at least 6 months. Aggressive treatment of risk factors and blood pressure control. I switch labetalol to carvedilol earlier today. The patient most likely might require a diuretic given elevated PCW/ LVEDP but will have to make sure his renal function remains stable. A total of 160 mL of contrast was used.    Cardiac Studies   11/16/2020  1. Left ventricular ejection fraction, by estimation, is 35 to 40%. The  left ventricle has moderately decreased function. The left ventricle  demonstrates global hypokinesis. There is mild left ventricular  hypertrophy. Left ventricular diastolic  parameters are consistent with Grade II diastolic dysfunction  (pseudonormalization). The average left ventricular global longitudinal  strain is -8.5 %. The global longitudinal strain is abnormal.  2. Right ventricular systolic function is normal. The right ventricular  size is normal.  3. Left atrial size was mildly dilated.  4. The mitral valve is normal in structure. Mild mitral valve  regurgitation.   Patient Profile     45 y.o. male with history of hypertension, type 2 diabetes, stage III chronic kidney disease and obstructive sleep apnea who presented with unstable angina.   Assessment & Plan    1. Unstable angina: cardiac cath showed severe 1 vessel CAD with 90% stenosis in mid LAD. S/p DES placement.  Continue dual antiplatelet therapy for at least 6 months.  Aggressive treatment of risk factors and cardiac rehab.   2. Chronic systolic heart failure: likely a combination of hypertensive heart disease and CAD. Labetalol was changed to Carvedilol. Continue Losartan and consider Delene Loll is an outpatient. PCW was  mildly elevated but hopefully should improve with BP control.   3. Essential hypertension: BP improved. Continue same meds for now.   4. Stage III CKD: check BMP upon follow up.      [facility] will sign off.   Medication Recommendations:  Discharge on current meds which were reviewed Other recommendations (labs, testing, etc):  BMP upon follow up in 1 week.  Follow up as an outpatient:  1-2 weeks. We will arrange.   For questions or updates, please contact [place] Please consult [email or web address] for contact info under        Signed, Kathlyn [place], MD  11/28/2020, 9:06 AM

## 2020-11-29 ENCOUNTER — Ambulatory Visit: Payer: BC Managed Care – PPO | Admitting: Family

## 2020-11-29 NOTE — Telephone Encounter (Signed)
Patient contacted regarding discharge from [facility] on 11/28/2020.  Patient understands to follow up with provider Dr. Saunders Revel on 12/06/20 at 11:40 am at [facility]. Patient understands discharge instructions? Yes Patient understands medications and regiment? Yes Patient understands to bring all medications to this visit? Yes  Patient requested to see cardiologist verses APP. He has not been assigned to anyone so offered first available with Dr. Harrell Gave End. Appointment scheduled and he verbalized agreement with day and time.

## 2020-12-06 ENCOUNTER — Ambulatory Visit: Payer: BC Managed Care – PPO | Admitting: Internal Medicine

## 2020-12-06 ENCOUNTER — Ambulatory Visit: Payer: BC Managed Care – PPO | Admitting: Physician Assistant

## 2020-12-06 ENCOUNTER — Other Ambulatory Visit: Payer: Self-pay

## 2020-12-06 ENCOUNTER — Encounter: Payer: Self-pay | Admitting: Internal Medicine

## 2020-12-06 VITALS — BP 160/110 | HR 95 | Ht 75.0 in | Wt 326.0 lb

## 2020-12-06 DIAGNOSIS — G4733 Obstructive sleep apnea (adult) (pediatric): Secondary | ICD-10-CM | POA: Diagnosis not present

## 2020-12-06 DIAGNOSIS — I10 Essential (primary) hypertension: Secondary | ICD-10-CM | POA: Diagnosis not present

## 2020-12-06 DIAGNOSIS — I25110 Atherosclerotic heart disease of native coronary artery with unstable angina pectoris: Secondary | ICD-10-CM

## 2020-12-06 DIAGNOSIS — I5020 Unspecified systolic (congestive) heart failure: Secondary | ICD-10-CM | POA: Diagnosis not present

## 2020-12-06 DIAGNOSIS — E785 Hyperlipidemia, unspecified: Secondary | ICD-10-CM

## 2020-12-06 DIAGNOSIS — I5022 Chronic systolic (congestive) heart failure: Secondary | ICD-10-CM

## 2020-12-06 DIAGNOSIS — I160 Hypertensive urgency: Secondary | ICD-10-CM

## 2020-12-06 DIAGNOSIS — E1169 Type 2 diabetes mellitus with other specified complication: Secondary | ICD-10-CM

## 2020-12-06 DIAGNOSIS — Z9989 Dependence on other enabling machines and devices: Secondary | ICD-10-CM

## 2020-12-06 DIAGNOSIS — I255 Ischemic cardiomyopathy: Secondary | ICD-10-CM

## 2020-12-06 MED ORDER — CARVEDILOL 25 MG PO TABS: 25.0000 mg | ORAL_TABLET | Freq: Two times a day (BID) | ORAL | 1 refills | Status: DC

## 2020-12-06 NOTE — Progress Notes (Signed)
Follow-up Outpatient Visit Date: 12/06/2020  Primary Care Provider: Mariel Sleet [address] [place] [address]   Primary Cardiologist: Esmond Plants, MD PhD  Chief Complaint: Follow-up coronary artery disease and recent PCI  HPI:  Ricky Moore is a 45 y.o. male with history of recently diagnosed coronary artery disease status post PCI to the mid LAD, hypertension, chronic kidney disease, and obstructive sleep apnea, who presents for follow-up of artery disease.  He was initially evaluated on 11/16/2020 by Dr. Rockey Situ in the ED for chest pain.  Troponin was mildly elevated but exam felt to be consistent with musculoskeletal pain.  Echo at that time showed LVEF of 35-40%.  He was discharged home but continued to have chest pain.  He was encouraged to go to the ER and reevaluated by our team on 11/27/2020.  He underwent right and left heart catheterization showed 90% mid LAD stenosis as well as total occlusions of OM1 and RPL2.  He underwent successful PCI to the mid LAD by Dr. Fletcher Anon.  Today, Mr. Situ reports that he is feeling much better since his PCI to the mid LAD.  He has occasional upper back pain somewhat reminiscent of what he felt before his PCI though he has had no further chest pain or shortness of breath.  He has tried taking sublingual nitroglycerin for relief of his back pain and notices occasional but inconsistent improvement.  Shortly after his PCI, he noticed some pressure in his chest with inspiration, which has resolved.  He has felt a little bit drowsy and wonders if his medications may be contributing to this.  He now finds himself taking a "power nap" many afternoons.  He has not had any palpitations, lightheadedness, or edema.  He also denies bleeding.  Home blood pressures have remained elevated, most recently 150/105.  Mr. Enslin is concerned that his CPAP may not be titrated properly as he has been using the current machine and settings for at  least 10 years.  --------------------------------------------------------------------------------------------------  Past Medical History:  Diagnosis Date  . Bladder tumor   . Complication of anesthesia    SLIGHT NAUSEA  . Diabetes mellitus without complication ([place])   . GERD (gastroesophageal reflux disease)    RELATED TO CERTAIN FOODS  . Hematuria   . Hypertension   . Sickle cell trait ([place])   . Sleep apnea    USES C PAP   Past Surgical History:  Procedure Laterality Date  . CARDIAC CATHETERIZATION    . CORONARY STENT INTERVENTION N/A 11/27/2020   Procedure: CORONARY STENT INTERVENTION;  Surgeon: Wellington Hampshire, MD;  Location: [facility];  Service: Cardiovascular;  Laterality: N/A;  . CYSTOSCOPY WITH RETROGRADE PYELOGRAM, URETEROSCOPY AND STENT PLACEMENT Bilateral 06/14/2015   Procedure: CYSTOSCOPY WITH BIOPSY, FULGERATION,  BILATERAL RETROGRADE PYELOGRAM;  Surgeon: Alexis Frock, MD;  Location: [facility];  Service: Urology;  Laterality: Bilateral;  . RIGHT/LEFT HEART CATH AND CORONARY ANGIOGRAPHY N/A 11/27/2020   Procedure: RIGHT/LEFT HEART CATH AND CORONARY ANGIOGRAPHY;  Surgeon: Wellington Hampshire, MD;  Location: [facility];  Service: Cardiovascular;  Laterality: N/A;  . TONSILLECTOMY  2008  . TRANSURETHRAL RESECTION OF BLADDER TUMOR WITH GYRUS (TURBT-GYRUS)  2010  . VASECTOMY Bilateral 06/14/2015   Procedure: VASECTOMY;  Surgeon: Alexis Frock, MD;  Location: [facility];  Service: Urology;  Laterality: Bilateral;     Current Meds  Medication Sig  . amLODipine (NORVASC) 10 MG tablet Take 10 mg by mouth daily.  [name]  aspirin 81 MG EC tablet Take 1 tablet (81 mg total) by mouth daily. Swallow whole.  [name] atorvastatin (LIPITOR) 40 MG tablet Take 1 tablet (40 mg total) by mouth daily.  . carvedilol (COREG) 12.5 MG tablet Take 1 tablet (12.5 mg total) by mouth 2 (two) times daily with a meal.  . clopidogrel (PLAVIX) 75 MG tablet Take 1 tablet (75 mg total) by mouth  daily with breakfast.  . gabapentin (NEURONTIN) 300 MG capsule Take 300 mg by mouth at bedtime.  . insulin detemir (LEVEMIR) 100 UNIT/ML injection Inject 30 Units into the skin at bedtime.  [name] JARDIANCE 25 MG TABS tablet Take 25 mg by mouth daily.  [name] losartan (COZAAR) 50 MG tablet Take 50 mg by mouth 2 (two) times daily.  . nitroGLYCERIN (NITROSTAT) 0.4 MG SL tablet Place 1 tablet (0.4 mg total) under the tongue every 5 (five) minutes x 3 doses as needed for chest pain.  [name] Respiratory Therapy Supplies (FLUTTER) DEVI Use as directed  . TRULICITY 3 SW/9.6PR SOPN Inject 3 mg into the skin once a week.    Allergies: Lisinopril, Molds & smuts, and Other  Social History   Tobacco Use  . Smoking status: Never Smoker  . Smokeless tobacco: Never Used  Vaping Use  . Vaping Use: Never used  Substance Use Topics  . Alcohol use: Yes    Alcohol/week: 0.0 standard drinks    Comment: RARE  . Drug use: No    Family History  Problem Relation Age of Onset  . Diabetes Mellitus II Maternal Aunt   . Hypertension Mother     Review of Systems: A 12-system review of systems was performed and was negative except as noted in the HPI.  --------------------------------------------------------------------------------------------------  Physical Exam: BP (!) 160/110 (BP Location: Left Arm, Patient Position: Sitting, Cuff Size: Large)   Pulse 95   Ht 6\' 3"  (1.905 m)   Wt (!) 326 lb (147.9 kg)   SpO2 97%   BMI 40.75 kg/m   General:  NAD. Neck: No JVD or HJR. Lungs: Clear to auscultation bilaterally without wheezes or crackles. Heart: Regular rate and rhythm without murmurs, rubs, or gallops. Abdomen: Soft, nontender, nondistended. Extremities: Right radial and femoral cath sites well-healed.  No hematoma noted.  2+ right radial and right femoral pulses.  No right femoral bruit.  EKG: Normal sinus rhythm with borderline LVH and inferior Q waves.  No significant change since 11/28/2020.  Lab  Results  Component Value Date   WBC 6.6 11/28/2020   HGB 15.2 11/28/2020   HCT 42.7 11/28/2020   MCV 75.8 (L) 11/28/2020   PLT 243 11/28/2020    Lab Results  Component Value Date   NA 137 11/28/2020   K 3.8 11/28/2020   CL 105 11/28/2020   CO2 24 11/28/2020   BUN 20 11/28/2020   CREATININE 1.81 (H) 11/28/2020   GLUCOSE 96 11/28/2020   ALT 21 11/15/2020    Lab Results  Component Value Date   CHOL 176 11/27/2020   HDL 44 11/27/2020   LDLCALC 101 (H) 11/27/2020   TRIG 154 (H) 11/27/2020   CHOLHDL 4.0 11/27/2020    --------------------------------------------------------------------------------------------------  ASSESSMENT AND PLAN: Unstable angina status post PCI: Mr. Amero is doing much better following PCI to the mid LAD in the setting of unstable angina and newly discovered cardiomyopathy last month.  He still has occasional pain in his upper back that was present before his PCI, though I am not convinced that this is reflective  of angina as his other chest pain and dyspnea have resolved.  We discussed the importance of aggressive secondary prevention including dual antiplatelet therapy for months.  He also has occlusions of small OM and PL branches that would be treated medically.  We will continue aspirin and clopidogrel as well as atorvastatin for secondary prevention.  Given elevated blood pressure and heart rate today, we will increase carvedilol to 25 mg twice daily.  Cardiac rehab encouraged.  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 Mittleman appears euvolemic and is without symptoms of decompensated heart failure.  We will escalate carvedilol to 25 mg twice daily and continue current doses of losartan and empagliflozin.  We we will consider addition of aldosterone antagonist at follow-up.  Obstructive sleep apnea: Mr. Dimichele has a history of obstructive sleep apnea but is concerned that his CPAP may not be titrated appropriately as he has been  using the same device/settings for at least 10 years.  We will refer him to our sleep medicine program for further evaluation.  Uncontrolled hypertension: Blood pressure remains elevated.  We will escalate carvedilol to 25 mg twice daily and continue current doses of amlodipine 10 mg daily and losartan 50 mg twice daily.  Addition of a thiazide diuretic and or spironolactone will be considered at follow-up blood pressure remains elevated.  Importance of sodium restriction was reinforced.  Suboptimally treated sleep apnea could also be contributing.  We will refer him to our sleep medicine program for further evaluation, as noted above.  Hyperlipidemia associated with type 2 diabetes mellitus: Continue atorvastatin 40 mg daily.  Mr. Debruin will need a follow-up lipid panel and ALT in 2 to 3 months.  Morbid obesity: BMI greater than 40 with mild comorbidities.  Weight loss encouraged through diet and exercise.  Follow-up: Return to clinic in 1 month with Dr. Rockey Situ or APP.  Nelva Bush, MD 12/06/2020 12:04 PM

## 2020-12-06 NOTE — Patient Instructions (Addendum)
Medication Instructions:  - Your physician has recommended you make the following change in your medication:   1) INCREASE coreg (carvedilol) to 25 mg- take 1 tablet by mouth TWICE daily   *If you need a refill on your cardiac medications before your next appointment, please call your pharmacy*   Lab Work: - none ordered  If you have labs (blood work) drawn today and your tests are completely normal, you will receive your results only by: [name] MyChart Message (if you have MyChart) OR . A paper copy in the mail If you have any lab test that is abnormal or we need to change your treatment, we will call you to review the results.   Testing/Procedures: - none ordered   Follow-Up: At [facility], you and your health needs are our priority.  As part of our continuing mission to provide you with exceptional heart care, we have created designated Provider Care Teams.  These Care Teams include your primary Cardiologist (physician) and Advanced Practice Providers (APPs -  Physician Assistants and Nurse Practitioners) who all work together to provide you with the care you need, when you need it.  We recommend signing up for the patient portal called "MyChart".  Sign up information is provided on this After Visit Summary.  MyChart is used to connect with patients for Virtual Visits (Telemedicine).  Patients are able to view lab/test results, encounter notes, upcoming appointments, etc.  Non-urgent messages can be sent to your provider as well.   To learn more about what you can do with MyChart, go to [email or web address].    Your next appointment:   1 month(s)  The format for your next appointment:   In Person  Provider:   You may see Ida Rogue, MD or one of the following Advanced Practice Providers on your designated Care Team:    Murray Hodgkins, NP  Christell Faith, PA-C  Marrianne Mood, PA-C  Cadence Sawpit, [place]  Laurann Montana, NP    Other Instructions  1) We will  refer you to Dr. Fransico Him (located in our [place] office) to establish care for your C-PAP- if you do not hear anything from Dr. Theodosia Blender scheduling team in the next week, please call [phone] to schedule (your referral is already in place to her)   2) You have been referred to cardiac rehab. This is a combination program including monitored exercise, dietary education, and support group. We strongly recommend participating in the program. Expect a phone call from them in approximately 2 weeks (from the initial referral). If you do not hear from them, the phone number for cardiac rehab at [facility] is [phone].

## 2020-12-07 ENCOUNTER — Encounter: Payer: Self-pay | Admitting: Internal Medicine

## 2020-12-07 DIAGNOSIS — E1169 Type 2 diabetes mellitus with other specified complication: Secondary | ICD-10-CM | POA: Insufficient documentation

## 2020-12-08 ENCOUNTER — Ambulatory Visit: Payer: BC Managed Care – PPO | Admitting: Physician Assistant

## 2021-01-03 DIAGNOSIS — I420 Dilated cardiomyopathy: Secondary | ICD-10-CM | POA: Insufficient documentation

## 2021-01-03 NOTE — Progress Notes (Signed)
Cardiology Office Note  Date:  01/05/2021   ID:  Ricky Moore, DOB 08-12-1976, MRN [id number]  PCP:  Elisabeth Cara, PA-C   Chief Complaint  Patient presents with  . 2 week follow up     "doing well." Medications reviewed by the patient verbally.     HPI:  [name] is a 45 y.o. male  with past medical history of Cardiomyopathy, hypertensive heart disease Coronary artery disease, Chronic kidney disease stage III Initially evaluated emergency room November 16, 2020 noted to have atypical chest pain Echocardiogram ejection fraction 35 to 40% Further chest pain at home, reevaluated November 27, 2020, right left heart catheterization 90% mid LAD stenosis with total occlusions of OM1, posterolateral branch #2, underwent PCI of the mid LAD Who presents for routine follow-up in the clinic for his coronary disease  Last seen by Dr. Saunders Revel in clinic December 06, 2020 Blood pressure on that visit was still elevated Coreg up to 25 twice daily  Dizzy/weakness malaise diaphoretic in office today No breakfast, took pills, feels lightheaded "feels like low sugars" Was given several M&Ms, sips of orange juice, quickly recovered Blood pressures were 220 systolic on my recheck, heart rates in the 80s when symptomatic  Lab work reviewed A1C 5.8, down from 11  EKG personally reviewed by myself on todays visit Shows normal sinus rhythm rate 81 bpm no significant ST-T wave changes  Echo, reviewed 1. Left ventricular ejection fraction, by estimation, is 35 to 40%. The  left ventricle has moderately decreased function. The left ventricle  demonstrates global hypokinesis. There is mild left ventricular  hypertrophy. Left ventricular diastolic  parameters are consistent with Grade II diastolic dysfunction  (pseudonormalization). The average left ventricular global longitudinal  strain is -8.5 %. The global longitudinal strain is abnormal.  2. Right ventricular systolic function is normal. The right  ventricular  size is normal.  3. Left atrial size was mildly dilated.  4. The mitral valve is normal in structure. Mild mitral valve  regurgitation.    PMH:   has a past medical history of Bladder tumor, Complication of anesthesia, Diabetes mellitus without complication ([place]), GERD (gastroesophageal reflux disease), Hematuria, Hypertension, Sickle cell trait ([place]), and Sleep apnea.  PSH:    Past Surgical History:  Procedure Laterality Date  . CARDIAC CATHETERIZATION    . CORONARY STENT INTERVENTION N/A 11/27/2020   Procedure: CORONARY STENT INTERVENTION;  Surgeon: Wellington Hampshire, MD;  Location: [facility];  Service: Cardiovascular;  Laterality: N/A;  . CYSTOSCOPY WITH RETROGRADE PYELOGRAM, URETEROSCOPY AND STENT PLACEMENT Bilateral 06/14/2015   Procedure: CYSTOSCOPY WITH BIOPSY, FULGERATION,  BILATERAL RETROGRADE PYELOGRAM;  Surgeon: Alexis Frock, MD;  [facility];  Service: Urology;  Laterality: Bilateral;  . RIGHT/LEFT HEART CATH AND CORONARY ANGIOGRAPHY N/A 11/27/2020   Procedure: RIGHT/LEFT HEART CATH AND CORONARY ANGIOGRAPHY;  Surgeon: Wellington Hampshire, MD;  Location: [facility];  Service: Cardiovascular;  Laterality: N/A;  . TONSILLECTOMY  2008  . TRANSURETHRAL RESECTION OF BLADDER TUMOR WITH GYRUS (TURBT-GYRUS)  2010  . VASECTOMY Bilateral 06/14/2015   Procedure: VASECTOMY;  Surgeon: Alexis Frock, MD;  [facility];  Service: Urology;  Laterality: Bilateral;    Current Outpatient Medications  Medication Sig Dispense Refill  . amLODipine (NORVASC) 10 MG tablet Take 10 mg by mouth daily.    [name] aspirin 81 MG EC tablet Take 1 tablet (81 mg total) by mouth daily. Swallow whole. 30 tablet 10  . atorvastatin (LIPITOR) 40 MG tablet Take  1 tablet (40 mg total) by mouth daily. 30 tablet 0  . carvedilol (COREG) 25 MG tablet Take 1 tablet (25 mg total) by mouth 2 (two) times daily. 180 tablet 1  . clopidogrel (PLAVIX) 75 MG tablet Take 1 tablet (75 mg  total) by mouth daily with breakfast. 30 tablet 3  . gabapentin (NEURONTIN) 300 MG capsule Take 300 mg by mouth at bedtime.    . insulin detemir (LEVEMIR) 100 UNIT/ML injection Inject 30 Units into the skin at bedtime.    [name] JARDIANCE 25 MG TABS tablet Take 25 mg by mouth daily.    [name] losartan (COZAAR) 50 MG tablet Take 50 mg by mouth 2 (two) times daily.    . nitroGLYCERIN (NITROSTAT) 0.4 MG SL tablet Place 1 tablet (0.4 mg total) under the tongue every 5 (five) minutes x 3 doses as needed for chest pain. 15 tablet 12  . Respiratory Therapy Supplies (FLUTTER) DEVI Use as directed 1 each 0  . TRULICITY 3 XW/9.6EA SOPN Inject 3 mg into the skin once a week.     No current facility-administered medications for this visit.     Allergies:   Lisinopril, Molds & smuts, and Other   Social History:  The patient  reports that he has never smoked. He has never used smokeless tobacco. He reports current alcohol use. He reports that he does not use drugs.   Family History:   family history includes Diabetes Mellitus II in his maternal aunt; Hypertension in his mother.    Review of Systems: Review of Systems  Constitutional: Positive for malaise/fatigue.       Episode of diaphoresis malaise, in the office, improved with juice, M&Ms  HENT: Negative.   Respiratory: Negative.   Cardiovascular: Negative.   Gastrointestinal: Negative.   Musculoskeletal: Negative.   Neurological: Negative.   Psychiatric/Behavioral: Negative.   All other systems reviewed and are negative.   PHYSICAL EXAM: VS:  BP (!) 141/98 (BP Location: Left Arm, Patient Position: Sitting, Cuff Size: Large)   Pulse 81   Ht 6\' 3"  (1.905 m)   Wt (!) 329 lb 2 oz (149.3 kg)   SpO2 98%   BMI 41.14 kg/m  , BMI Body mass index is 41.14 kg/m. GEN: Well nourished, well developed, in no acute distress HEENT: normal Neck: no JVD, carotid bruits, or masses Cardiac: RRR; no murmurs, rubs, or gallops,no edema  Respiratory:  clear to  auscultation bilaterally, normal work of breathing GI: soft, nontender, nondistended, + BS MS: no deformity or atrophy Skin: warm and dry, no rash Neuro:  Strength and sensation are intact Psych: euthymic mood, full affect   Recent Labs: 11/15/2020: ALT 21 11/28/2020: BUN 20; Creatinine, Ser 1.81; Hemoglobin 15.2; Platelets 243; Potassium 3.8; Sodium 137    Lipid Panel Lab Results  Component Value Date   CHOL 176 11/27/2020   HDL 44 11/27/2020   LDLCALC 101 (H) 11/27/2020   TRIG 154 (H) 11/27/2020      Wt Readings from Last 3 Encounters:  01/05/21 (!) 329 lb 2 oz (149.3 kg)  12/06/20 (!) 326 lb (147.9 kg)  11/28/20 (!) 321 lb 9.6 oz (145.9 kg)       ASSESSMENT AND PLAN:  Problem List Items Addressed This Visit      Cardiology Problems   Dilated cardiomyopathy ([place])   Coronary artery disease involving native coronary artery of native heart with unstable angina pectoris ([place]) - Primary     Other   Hyperlipidemia associated with type 2  diabetes mellitus ([place])   Type 2 diabetes mellitus with diabetic polyneuropathy, with long-term current use of insulin ([facility])     Cardiomyopathy We will stop the losartan, amlodipine, changed to Entresto 49/51 mg p.o. twice daily Continue carvedilol 25 twice daily Recommend he monitor blood pressure, call us with numbers, will likely need to go to higher dose Entresto 97/103 mg twice daily -Recommend repeat echo in 3 months time to make sure normalizes as EF was 35% in March 2022  Diabetes type 2 with complications Z6X much improved Little to eat this morning apart from water, had low sugar by symptoms, improved with orange juice M&Ms, recommend he delay his morning diabetes medications until he has had food  Essential hypertension Management as above, holding amlodipine and losartan, changed to [place], coupon provided May need to adjust dose higher depending on blood pressure  Coronary disease with stable angina Continue aspirin  Plavix statin goal LDL less than 70  Episode of diaphoresis, low sugars, requiring crackers M&Ms orange juice Symptoms resolved Several medication changes, discussion of low sugars and cardiomyopathy  Total encounter time more than 35 minutes  Greater than 50% was spent in counseling and coordination of care with the patient    Signed, Esmond Plants, M.D., Ph.D. [place], [place]

## 2021-01-05 ENCOUNTER — Encounter: Payer: Self-pay | Admitting: Cardiovascular Disease

## 2021-01-05 ENCOUNTER — Ambulatory Visit: Payer: BC Managed Care – PPO | Admitting: Cardiovascular Disease

## 2021-01-05 ENCOUNTER — Other Ambulatory Visit: Payer: Self-pay

## 2021-01-05 VITALS — BP 141/98 | HR 81 | Ht 75.0 in | Wt 329.1 lb

## 2021-01-05 DIAGNOSIS — I420 Dilated cardiomyopathy: Secondary | ICD-10-CM

## 2021-01-05 DIAGNOSIS — E785 Hyperlipidemia, unspecified: Secondary | ICD-10-CM

## 2021-01-05 DIAGNOSIS — E1169 Type 2 diabetes mellitus with other specified complication: Secondary | ICD-10-CM | POA: Diagnosis not present

## 2021-01-05 DIAGNOSIS — E1142 Type 2 diabetes mellitus with diabetic polyneuropathy: Secondary | ICD-10-CM

## 2021-01-05 DIAGNOSIS — I25110 Atherosclerotic heart disease of native coronary artery with unstable angina pectoris: Secondary | ICD-10-CM

## 2021-01-05 DIAGNOSIS — I429 Cardiomyopathy, unspecified: Secondary | ICD-10-CM

## 2021-01-05 DIAGNOSIS — Z794 Long term (current) use of insulin: Secondary | ICD-10-CM

## 2021-01-05 MED ORDER — ATORVASTATIN CALCIUM 40 MG PO TABS: 40.0000 mg | ORAL_TABLET | Freq: Every day | ORAL | 3 refills | Status: DC

## 2021-01-05 MED ORDER — CLOPIDOGREL BISULFATE 75 MG PO TABS: 75.0000 mg | ORAL_TABLET | Freq: Every day | ORAL | 3 refills | Status: DC

## 2021-01-05 NOTE — Patient Instructions (Addendum)
Medication Instructions:  STOP  amlodipine  Losartan  START Entresto 49/51 mg twice a day (Coupon given, please register)  One bottle of free sample  Lot [id number]  Exp 01/2023  If you need a refill on your cardiac medications before your next appointment, please call your pharmacy.    Lab work: BMP (1 month)  Walk into medical mall at the check in desk, they will direct you to lab registration, hours for labs are Monday-Friday 07:00am-5:30pm (no appointment necessary)  Testing/Procedures: Echo in 3 months cardiomyopathy  Your physician has requested that you have an echocardiogram. Echocardiography is a painless test that uses sound waves to create images of your heart. It provides your doctor with information about the size and shape of your heart and how well your heart's chambers and valves are working. This procedure takes approximately one hour. There are no restrictions for this procedure.  There is a possibility that an IV may need to be started during your test to inject an image enhancing agent. This is done to obtain more optimal pictures of your heart. Therefore we ask that you do at least drink some water prior to coming in to hydrate your veins.    Follow-Up:  . You will need a follow up appointment in 3 months after echo, app ok  . Providers on your designated Care Team:   . Murray Hodgkins, NP . Christell Faith, PA-C . Marrianne Mood, PA-C  Monitor blood pressure, goal <130  Please monitor blood pressures and keep a log of your readings.   How to use a home blood pressure monitor. . Be still. . Measure at the same time every day. It's important to take the readings at the same time each day, such as morning and evening. Take reading approximately 1 1/2 to 2 hours after BP medications.   AVOID these things for 30 minutes before checking your blood pressure:  Drinking caffeine.  Drinking alcohol.  Eating.  Smoking.  Exercising.   Five minutes  before checking your blood pressure:  Pee.  Sit in a dining chair. Avoid sitting in a soft couch or armchair.  Be quiet. Do not talk.       Sit correctly. Sit with your back straight and supported (on a dining chair, rather than a sofa). Your feet should be flat on the floor and your legs should not be crossed. Your arm should be supported on a flat surface (such as a table) with the upper arm at heart level. Make sure the bottom of the cuff is placed directly above the bend of the elbow.    COVID-19 Vaccine Information can be found at: ShippingScam.co.uk For questions related to vaccine distribution or appointments, please email vaccine@[email or web address] or call [phone].

## 2021-01-08 DIAGNOSIS — Z20822 Contact with and (suspected) exposure to covid-19: Secondary | ICD-10-CM | POA: Diagnosis not present

## 2021-01-31 ENCOUNTER — Ambulatory Visit: Payer: BC Managed Care – PPO | Admitting: Cardiology

## 2021-02-28 DIAGNOSIS — R0989 Other specified symptoms and signs involving the circulatory and respiratory systems: Secondary | ICD-10-CM | POA: Diagnosis not present

## 2021-02-28 DIAGNOSIS — I10 Essential (primary) hypertension: Secondary | ICD-10-CM | POA: Diagnosis not present

## 2021-02-28 DIAGNOSIS — J0141 Acute recurrent pansinusitis: Secondary | ICD-10-CM | POA: Diagnosis not present

## 2021-03-27 DIAGNOSIS — Z794 Long term (current) use of insulin: Secondary | ICD-10-CM | POA: Diagnosis not present

## 2021-03-27 DIAGNOSIS — I129 Hypertensive chronic kidney disease with stage 1 through stage 4 chronic kidney disease, or unspecified chronic kidney disease: Secondary | ICD-10-CM | POA: Diagnosis not present

## 2021-03-27 DIAGNOSIS — E1122 Type 2 diabetes mellitus with diabetic chronic kidney disease: Secondary | ICD-10-CM | POA: Diagnosis not present

## 2021-03-27 DIAGNOSIS — E1169 Type 2 diabetes mellitus with other specified complication: Secondary | ICD-10-CM | POA: Diagnosis not present

## 2021-03-27 DIAGNOSIS — N1831 Chronic kidney disease, stage 3a: Secondary | ICD-10-CM | POA: Diagnosis not present

## 2021-04-10 ENCOUNTER — Ambulatory Visit (INDEPENDENT_AMBULATORY_CARE_PROVIDER_SITE_OTHER): Payer: BC Managed Care – PPO

## 2021-04-10 ENCOUNTER — Other Ambulatory Visit: Payer: Self-pay

## 2021-04-10 DIAGNOSIS — I429 Cardiomyopathy, unspecified: Secondary | ICD-10-CM

## 2021-04-10 DIAGNOSIS — I420 Dilated cardiomyopathy: Secondary | ICD-10-CM | POA: Diagnosis not present

## 2021-04-10 MED ORDER — PERFLUTREN LIPID MICROSPHERE
1.0000 mL | INTRAVENOUS | Status: AC | PRN
  Administered 2021-04-10: 2 mL via INTRAVENOUS

## 2021-04-11 ENCOUNTER — Telehealth: Payer: Self-pay

## 2021-04-11 LAB — ECHOCARDIOGRAM COMPLETE
AR max vel: 3 cm2
AV Area VTI: 3.3 cm2
AV Area mean vel: 3.1 cm2
AV Mean grad: 6 mmHg
AV Peak grad: 11.4 mmHg
Ao pk vel: 1.69 m/s
Area-P 1/2: 4.77 cm2
S' Lateral: 4.3 cm

## 2021-04-11 NOTE — Telephone Encounter (Signed)
Left detail message on VM of pt's recent results okay by DPR, Dr. Rockey Situ advised   "Improving cardiac function, ejection fraction/EF on echo,  Was 35%  Now 38 to 50%  55% being normal  Good news overall"  At this time, no further recommendations or medications changes, advised to call office for any concerns or questions, otherwise will see at next visit.   Pt has seen his results and Dr. Donivan Scull notes on his MyChart account

## 2021-04-16 ENCOUNTER — Ambulatory Visit: Payer: BC Managed Care – PPO | Admitting: Cardiology

## 2021-04-18 ENCOUNTER — Ambulatory Visit: Payer: BC Managed Care – PPO | Admitting: Medical

## 2021-04-26 DIAGNOSIS — Z20822 Contact with and (suspected) exposure to covid-19: Secondary | ICD-10-CM | POA: Diagnosis not present

## 2021-05-15 NOTE — Progress Notes (Signed)
Cardiology Office Note:    Date:  05/18/2021   ID:  Ricky Moore, DOB August 03, 1976, MRN [id number]  PCP:  Elisabeth Cara, [place] Cardiologist: Dr. [facility] Electrophysiologist:  None   Referring MD: Belva Bertin, [place], *   Chief Complaint: 3 month follow-up  History of Present Illness:    Ricky Moore is a 45 y.o. male with a hx of Cardiomyopathy, hypertensive heart disease, CAD s/p PCI to the mLAD, CKD stage 3, OSA who presents for 3 month follow-up.   He was initially evaluated on 11/16/20 by Dr. Rockey Situ in the ED for chest pain. Troponin was mildly elevated but exam felt to be consistent with MSK pain. Echo at that time showed LVEF 35-40%. He was discharged home but continued to have chest pain. He was encouraged to go to the ER and reevaluated by our team 11/08/31. He underwent right and left heart cath that showed 90% midLAD stenosis as well as total occlusions of OM1 and RPL2. He underwent successful PCI to the mLAD by Dr. Fletcher Anon.   He was last seen 01/05/21 and was feeling weak as if he had low sugars, although A1C was much improved. Losartan was changed to [facility].Follow-up echo was ordered reassess EF.   Today, the patient reports some chest dullness. It is intermittent. He has been trying to do more exercise. He may have chest dullness after the exercise, however chest pain can also be at rest. Pain is similar to pain prior to the stent, but much less severe. Hasn't felt he has needed to take NTG.  He is doing 10-15 minutes of activity a day. He is making diet changes. Diabetes is better controlled. He has to see another doctor regarding CPAP machine, in October. Might need a new one. BP high, but has not had medications today. Doesn't check BP at home.    Past Medical History:  Diagnosis Date   Bladder tumor    Complication of anesthesia    SLIGHT NAUSEA   Diabetes mellitus without complication (HCC)    GERD (gastroesophageal reflux disease)     RELATED TO CERTAIN FOODS   Hematuria    Hypertension    Sickle cell trait ([place])    Sleep apnea    USES C PAP    Past Surgical History:  Procedure Laterality Date   CARDIAC CATHETERIZATION     CORONARY STENT INTERVENTION N/A 11/27/2020   Procedure: CORONARY STENT INTERVENTION;  Surgeon: Wellington Hampshire, MD;  Location: [place] [facility];  Service: Cardiovascular;  Laterality: N/A;   CYSTOSCOPY WITH RETROGRADE PYELOGRAM, URETEROSCOPY AND STENT PLACEMENT Bilateral 06/14/2015   Procedure: CYSTOSCOPY WITH BIOPSY, FULGERATION,  BILATERAL RETROGRADE PYELOGRAM;  Surgeon: Alexis Frock, MD;  Location: [facility];  Service: Urology;  Laterality: Bilateral;   RIGHT/LEFT HEART CATH AND CORONARY ANGIOGRAPHY N/A 11/27/2020   Procedure: RIGHT/LEFT HEART CATH AND CORONARY ANGIOGRAPHY;  Surgeon: Wellington Hampshire, MD;  Location: [place] [facility];  Service: Cardiovascular;  Laterality: N/A;   TONSILLECTOMY  2008   TRANSURETHRAL RESECTION OF BLADDER TUMOR WITH GYRUS (TURBT-GYRUS)  2010   VASECTOMY Bilateral 06/14/2015   Procedure: VASECTOMY;  Surgeon: Alexis Frock, MD;  Location: [facility];  Service: Urology;  Laterality: Bilateral;    Current Medications: Current Meds  Medication Sig   amLODipine (NORVASC) 10 MG tablet Take 10 mg by mouth daily.   aspirin 81 MG EC tablet Take 1 tablet (81 mg total) by mouth daily. Swallow whole.   atorvastatin (LIPITOR) 40  MG tablet Take 1 tablet (40 mg total) by mouth daily.   carvedilol (COREG) 25 MG tablet Take 1 tablet (25 mg total) by mouth 2 (two) times daily.   clopidogrel (PLAVIX) 75 MG tablet Take 1 tablet (75 mg total) by mouth daily with breakfast.   gabapentin (NEURONTIN) 300 MG capsule Take 300 mg by mouth at bedtime as needed.   insulin detemir (LEVEMIR) 100 UNIT/ML injection Inject 30 Units into the skin at bedtime.   isosorbide mononitrate (IMDUR) 30 MG 24 hr tablet Take 0.5 tablets (15 mg total) by mouth daily.   JARDIANCE 25 MG TABS tablet Take 25  mg by mouth daily.   losartan (COZAAR) 100 MG tablet Take by mouth daily.   nitroGLYCERIN (NITROSTAT) 0.4 MG SL tablet Place 1 tablet (0.4 mg total) under the tongue every 5 (five) minutes x 3 doses as needed for chest pain.   TRULICITY 3 0000000 SOPN Inject 3 mg into the skin once a week.     Allergies:   Lisinopril, Molds & smuts, and Other   Social History   Socioeconomic History   Marital status: Married    Spouse name: Not on file   Number of children: Not on file   Years of education: Not on file   Highest education level: Not on file  Occupational History   Not on file  Tobacco Use   Smoking status: Never   Smokeless tobacco: Never  Vaping Use   Vaping Use: Never used  Substance and Sexual Activity   Alcohol use: Yes    Alcohol/week: 0.0 standard drinks    Comment: RARE   Drug use: No   Sexual activity: Not on file  Other Topics Concern   Not on file  Social History Narrative   ** Merged History Encounter **       Social Determinants of Health   Financial Resource Strain: Not on file  Food Insecurity: Not on file  Transportation Needs: Not on file  Physical Activity: Not on file  Stress: Not on file  Social Connections: Not on file     Family History: The patient's family history includes Diabetes Mellitus II in his maternal aunt; Hypertension in his mother.  ROS:   Please see the history of present illness.     All other systems reviewed and are negative.  EKGs/Labs/Other Studies Reviewed:    The following studies were reviewed today:  Echo 04/10/21   1. Left ventricular ejection fraction, by estimation, is 45 to 50%. The  left ventricle has mildly decreased function. The left ventricle  demonstrates regional wall motion abnormalities (see scoring  diagram/findings for description). There is mild left  ventricular hypertrophy. Left ventricular diastolic parameters are  consistent with Grade I diastolic dysfunction (impaired relaxation).  Elevated  left atrial pressure. There is mild hypokinesis of the left  ventricular, basal-mid lateral wall and  inferolateral wall.   2. Right ventricular systolic function is normal. The right ventricular  size is normal. Tricuspid regurgitation signal is inadequate for assessing  PA pressure.   3. Left atrial size was mildly dilated.   4. The mitral valve is normal in structure. Trivial mitral valve  regurgitation. No evidence of mitral stenosis.   5. The aortic valve was not well visualized. Aortic valve regurgitation  is not visualized. No aortic stenosis is present.   6. Aortic dilatation noted. There is borderline dilatation of the aortic  root, measuring 39 mm.   R/L Cardiac cath 11/27/20 RPDA lesion is  30% stenosed. 2nd RPL lesion is 100% stenosed. Mid LAD lesion is 90% stenosed. Post intervention, there is a 0% residual stenosis. A drug-eluting stent was successfully placed using a STENT RESOLUTE ONYX 3.5X30. 1st Mrg lesion is 100% stenosed.   1.  Severe one-vessel coronary artery disease with 90% stenosis in the mid LAD.In addition, the patient has small vessel disease affecting RPL 2 and OM1.  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 2.  Right heart catheterization showed mildly elevated filling pressures with pulmonary capillary wedge pressure of 17 mmHg, mild pulmonary hypertension and normal cardiac output. 3.  Successful IVUS and drug-eluting stent placement to the mid LAD.   Recommendations: Dual antiplatelet therapy for at least 6 months. Aggressive treatment of risk factors and blood pressure control. I switch labetalol to carvedilol earlier today. The patient most likely might require a diuretic given elevated PCW/ LVEDP but will have to make sure his renal function remains stable. A total of 160 mL of contrast was used.   Avoid catheterization via the right radial artery in the future due to a large radial loop that could not be navigated  with the wire and catheter.  Coronary Diagrams  Diagnostic Dominance: Right Intervention    EKG:  EKG is  ordered today.  The ekg ordered today demonstrates NSR, 82bpm, TWI III (old), LAD  Recent Labs: 11/15/2020: ALT 21 11/28/2020: BUN 20; Creatinine, Ser 1.81; Hemoglobin 15.2; Platelets 243; Potassium 3.8; Sodium 137  Recent Lipid Panel    Component Value Date/Time   CHOL 176 11/27/2020 0222   TRIG 154 (H) 11/27/2020 0222   HDL 44 11/27/2020 0222   CHOLHDL 4.0 11/27/2020 0222   VLDL 31 11/27/2020 0222   LDLCALC 101 (H) 11/27/2020 0222    Physical Exam:    VS:  BP (!) 160/110 (BP Location: Left Arm, Patient Position: Sitting, Cuff Size: Large)   Pulse 82   Ht '6\' 3"'$  (1.905 m)   Wt (!) 335 lb (152 kg)   SpO2 97%   BMI 41.87 kg/m     Wt Readings from Last 3 Encounters:  05/18/21 (!) 335 lb (152 kg)  01/05/21 (!) 329 lb 2 oz (149.3 kg)  12/06/20 (!) 326 lb (147.9 kg)     GEN:  Well nourished, well developed in no acute distress HEENT: Normal NECK: No JVD; No carotid bruits LYMPHATICS: No lymphadenopathy CARDIAC: RRR, no murmurs, rubs, gallops RESPIRATORY:  Clear to auscultation without rales, wheezing or rhonchi  ABDOMEN: Soft, non-tender, non-distended MUSCULOSKELETAL:  No edema; No deformity  SKIN: Warm and dry NEUROLOGIC:  Alert and oriented x 3 PSYCHIATRIC:  Normal affect   ASSESSMENT:    1. Coronary artery disease involving native coronary artery of native heart with unstable angina pectoris ([place])   2. Chronic systolic heart failure ([place])   3. OSA on CPAP   4. Cardiomyopathy, unspecified type ([place])   5. Essential hypertension   6. Morbid obesity ([place])   7. Screening for hyperlipidemia    PLAN:    In order of problems listed above:  Chest pain CAD s/p PCI/DES mLAD Patient reports intermittent dull chest pain episodes similar to prior stenting, but much less severe. He has not needed NTG. EKG today with no new changes. Start Imdur '15mg'$  daily.  Caution given h/o dizziness and weakness. He will check blood pressures for a week and call to report number. He was instructed he can stop Imdur for hypotension or symptoms (dizziness/lightheadedness). Reevaluate  symptoms in 3 months. Continue Aspirin and plavix. Encouraged diet and lifestyle changes.   HFrEF Echo from 10/2020 showed LVEF 35-40%. Echo from 04/2021 showed improved LVEF 45-50%, WMA, mild LVH, G1DD, mild HK, mildly dilated LA, trivial MR, aortic dilation 34m. He is euvolemic on exam. H/o of dizziness on Entresto. Continue Losartan '100mg'$  daily, Coreg '25mg'$  daily, Jardiance '25mg'$  daily.  DM2 A1c 5.8. He is improving diet and exercise.   HTN BP high, but has not had his medications today. Recommend checking BP for a week for a baseline and calling in, also with addition of Imdur '15mg'$  daily. Continue amlodipine '10mg'$  daily, Coreg '25mg'$  BID, Losartan '25mg'$  daily.    OSA He will follow-up with CPAP MD in October  HLD LDL 101 10/2020. Check LFTs and fasting lipid panel today. Diet and exercise encouraged. Continue Lipitor.   Disposition: Follow up in 3 month(s) with MD/APP    Signed, Charm Stenner HNinfa Meeker PA-C  05/18/2021 9:29 AM    [facility]

## 2021-05-18 ENCOUNTER — Other Ambulatory Visit: Payer: Self-pay

## 2021-05-18 ENCOUNTER — Ambulatory Visit (INDEPENDENT_AMBULATORY_CARE_PROVIDER_SITE_OTHER): Payer: BC Managed Care – PPO | Admitting: Medical

## 2021-05-18 ENCOUNTER — Encounter: Payer: Self-pay | Admitting: Medical

## 2021-05-18 VITALS — BP 160/110 | HR 82 | Ht 75.0 in | Wt 335.0 lb

## 2021-05-18 DIAGNOSIS — I25110 Atherosclerotic heart disease of native coronary artery with unstable angina pectoris: Secondary | ICD-10-CM | POA: Diagnosis not present

## 2021-05-18 DIAGNOSIS — Z9989 Dependence on other enabling machines and devices: Secondary | ICD-10-CM

## 2021-05-18 DIAGNOSIS — I5022 Chronic systolic (congestive) heart failure: Secondary | ICD-10-CM

## 2021-05-18 DIAGNOSIS — G4733 Obstructive sleep apnea (adult) (pediatric): Secondary | ICD-10-CM

## 2021-05-18 DIAGNOSIS — Z13220 Encounter for screening for lipoid disorders: Secondary | ICD-10-CM

## 2021-05-18 DIAGNOSIS — I429 Cardiomyopathy, unspecified: Secondary | ICD-10-CM | POA: Diagnosis not present

## 2021-05-18 DIAGNOSIS — I10 Essential (primary) hypertension: Secondary | ICD-10-CM

## 2021-05-18 MED ORDER — ISOSORBIDE MONONITRATE ER 30 MG PO TB24: 15.0000 mg | ORAL_TABLET | Freq: Every day | ORAL | 3 refills | Status: DC

## 2021-05-18 NOTE — Patient Instructions (Addendum)
Medication Instructions:   Please START IMDUR 15 mg daily  *If you need a refill on your cardiac medications before your next appointment, please call your pharmacy*   Lab Work: Lipds & LFTS LABS WILL APPEAR ON MYCHART, ABNORMAL RESULTS WILL BE CALLED  Testing/Procedures: None  Follow-Up: At [facility], you and your health needs are our priority.  As part of our continuing mission to provide you with exceptional heart care, we have created designated Provider Care Teams.  These Care Teams include your primary Cardiologist (physician) and Advanced Practice Providers (APPs -  Physician Assistants and Nurse Practitioners) who all work together to provide you with the care you need, when you need it.  Your next appointment:   3 month(s)  The format for your next appointment:   In Person  Provider:   Cadence Kathlen Mody, PA-C   Other Instructions Please monitor blood pressures and keep a log of your readings.  Call the clinic in 1 week with BP readings.   How to use a home blood pressure monitor. Be still. Measure at the same time every day. It's important to take the readings at the same time each day, such as morning and evening. Take reading approximately 1 1/2 to 2 hours after BP medications.   AVOID these things for 30 minutes before checking your blood pressure: Drinking caffeine. Drinking alcohol. Eating. Smoking. Exercising.

## 2021-05-19 LAB — LIPID PANEL
Chol/HDL Ratio: 3.1 ratio (ref 0.0–5.0)
Cholesterol, Total: 135 mg/dL (ref 100–199)
HDL: 44 mg/dL (ref >39)
LDL Chol Calc (NIH): 79 mg/dL (ref 0–99)
Triglycerides: 58 mg/dL (ref 0–149)
VLDL Cholesterol Cal: 12 mg/dL (ref 5–40)

## 2021-05-19 LAB — HEPATIC FUNCTION PANEL
ALT: 19 IU/L (ref 0–44)
AST: 17 IU/L (ref 0–40)
Albumin: 4.8 g/dL (ref 4.0–5.0)
Alkaline Phosphatase: 96 IU/L (ref 44–121)
Bilirubin Total: 0.6 mg/dL (ref 0.0–1.2)
Bilirubin, Direct: 0.2 mg/dL (ref 0.00–0.40)
Total Protein: 7.4 g/dL (ref 6.0–8.5)

## 2021-05-22 ENCOUNTER — Telehealth: Payer: Self-pay | Admitting: Medical

## 2021-05-22 NOTE — Telephone Encounter (Signed)
Called patient and left a VM requesting a call back. 

## 2021-05-22 NOTE — Telephone Encounter (Signed)
Cadence Ninfa Meeker, PA-C  05/22/2021  7:48 AM EDT Back to Top    Liver function is normal.  Lipid panel showed LDL 79, goal less than 70. If he has not history of stati intolerance lets increase lipitor to 80mg  daily.

## 2021-05-22 NOTE — Telephone Encounter (Signed)
Called the patient to review lab results and recommendation. Lmtcb.

## 2021-05-23 NOTE — Telephone Encounter (Signed)
3rd attempt to contact the patient by telephone. Lmtcb. Results also released to mychart with the providers comments.

## 2021-05-25 ENCOUNTER — Other Ambulatory Visit: Payer: Self-pay | Admitting: *Deleted

## 2021-05-25 DIAGNOSIS — E785 Hyperlipidemia, unspecified: Secondary | ICD-10-CM

## 2021-05-25 DIAGNOSIS — E1169 Type 2 diabetes mellitus with other specified complication: Secondary | ICD-10-CM

## 2021-05-25 MED ORDER — ATORVASTATIN CALCIUM 80 MG PO TABS: 80.0000 mg | ORAL_TABLET | Freq: Every day | ORAL | 3 refills | Status: DC

## 2021-06-01 DIAGNOSIS — Z0279 Encounter for issue of other medical certificate: Secondary | ICD-10-CM

## 2021-06-01 NOTE — Telephone Encounter (Signed)
Recieved request from : Runell Gess of [employer] and signed placed in nurse box

## 2021-06-05 NOTE — Telephone Encounter (Signed)
Lmov to notify patient forms completed and faxed to [facility].

## 2021-06-06 ENCOUNTER — Other Ambulatory Visit: Payer: Self-pay | Admitting: Internal Medicine

## 2021-06-26 ENCOUNTER — Ambulatory Visit: Payer: BC Managed Care – PPO | Admitting: Cardiology

## 2021-08-06 ENCOUNTER — Ambulatory Visit: Payer: BC Managed Care – PPO | Admitting: Medical

## 2021-08-08 ENCOUNTER — Ambulatory Visit: Payer: BC Managed Care – PPO | Admitting: Cardiology

## 2021-09-14 ENCOUNTER — Ambulatory Visit: Payer: BC Managed Care – PPO | Admitting: Cardiology

## 2021-09-21 ENCOUNTER — Ambulatory Visit: Payer: BC Managed Care – PPO | Admitting: Medical

## 2021-10-12 ENCOUNTER — Ambulatory Visit: Payer: BC Managed Care – PPO | Admitting: Medical

## 2021-10-15 DIAGNOSIS — Z7984 Long term (current) use of oral hypoglycemic drugs: Secondary | ICD-10-CM | POA: Diagnosis not present

## 2021-10-15 DIAGNOSIS — N1831 Chronic kidney disease, stage 3a: Secondary | ICD-10-CM | POA: Diagnosis not present

## 2021-10-15 DIAGNOSIS — I10 Essential (primary) hypertension: Secondary | ICD-10-CM | POA: Diagnosis not present

## 2021-10-15 DIAGNOSIS — I129 Hypertensive chronic kidney disease with stage 1 through stage 4 chronic kidney disease, or unspecified chronic kidney disease: Secondary | ICD-10-CM | POA: Diagnosis not present

## 2021-10-15 DIAGNOSIS — Z794 Long term (current) use of insulin: Secondary | ICD-10-CM | POA: Diagnosis not present

## 2021-10-15 DIAGNOSIS — E1169 Type 2 diabetes mellitus with other specified complication: Secondary | ICD-10-CM | POA: Diagnosis not present

## 2021-10-15 DIAGNOSIS — E1122 Type 2 diabetes mellitus with diabetic chronic kidney disease: Secondary | ICD-10-CM | POA: Diagnosis not present

## 2021-10-16 ENCOUNTER — Telehealth: Payer: Self-pay | Admitting: *Deleted

## 2021-10-16 NOTE — Telephone Encounter (Signed)
Gae Bon please find out where this patient had her sleep studies done and who ordered a sleep study and the device.  This patient is on my schedule for tomorrow but I cannot find any information anywhere.  Apparently he was following up with somebody else from sleep apnea and can you please find out who that is and who the DME is and get a download

## 2021-10-16 NOTE — Progress Notes (Deleted)
Cardiology Office Note:    Date:  10/16/2021   ID:  Ricky Moore, DOB 23-Nov-1975, MRN [id number]  PCP:  Elisabeth Cara, PA-C  Cardiologist:  None    Referring MD: Nelva Bush, MD   No chief complaint on file.   History of Present Illness:    Ricky Moore is a 46 y.o. male with a hx of hypertension, GERD, diabetes, ASCHD who has a history of obstructive sleep apnea and has been on CPAP.  Past Medical History:  Diagnosis Date   Bladder tumor    Complication of anesthesia    SLIGHT NAUSEA   Diabetes mellitus without complication (HCC)    GERD (gastroesophageal reflux disease)    RELATED TO CERTAIN FOODS   Hematuria    Hypertension    Sickle cell trait ([place])    Sleep apnea    USES C PAP    Past Surgical History:  Procedure Laterality Date   CARDIAC CATHETERIZATION     CORONARY STENT INTERVENTION N/A 11/27/2020   Procedure: CORONARY STENT INTERVENTION;  Surgeon: Wellington Hampshire, MD;  Location: [place] [facility];  Service: Cardiovascular;  Laterality: N/A;   CYSTOSCOPY WITH RETROGRADE PYELOGRAM, URETEROSCOPY AND STENT PLACEMENT Bilateral 06/14/2015   Procedure: CYSTOSCOPY WITH BIOPSY, FULGERATION,  BILATERAL RETROGRADE PYELOGRAM;  Surgeon: Alexis Frock, MD;  Location[facility];  Service: Urology;  Laterality: Bilateral;   RIGHT/LEFT HEART CATH AND CORONARY ANGIOGRAPHY N/A 11/27/2020   Procedure: RIGHT/LEFT HEART CATH AND CORONARY ANGIOGRAPHY;  Surgeon: Wellington Hampshire, MD;  Location: [place] [facility];  Service: Cardiovascular;  Laterality: N/A;   TONSILLECTOMY  2008   TRANSURETHRAL RESECTION OF BLADDER TUMOR WITH GYRUS (TURBT-GYRUS)  2010   VASECTOMY Bilateral 06/14/2015   Procedure: VASECTOMY;  Surgeon: Alexis Frock, MD;  Location[facility];  Service: Urology;  Laterality: Bilateral;    Current Medications: No outpatient medications have been marked as taking for the 10/17/21 encounter (Appointment) with Sueanne Margarita, MD.     Allergies:    Lisinopril, Molds & smuts, and Other   Social History   Socioeconomic History   Marital status: Married    Spouse name: Not on file   Number of children: Not on file   Years of education: Not on file   Highest education level: Not on file  Occupational History   Not on file  Tobacco Use   Smoking status: Never   Smokeless tobacco: Never  Vaping Use   Vaping Use: Never used  Substance and Sexual Activity   Alcohol use: Yes    Alcohol/week: 0.0 standard drinks    Comment: RARE   Drug use: No   Sexual activity: Not on file  Other Topics Concern   Not on file  Social History Narrative   ** Merged History Encounter **       Social Determinants of Health   Financial Resource Strain: Not on file  Food Insecurity: Not on file  Transportation Needs: Not on file  Physical Activity: Not on file  Stress: Not on file  Social Connections: Not on file     Family History: The patient's family history includes Diabetes Mellitus II in his maternal aunt; Hypertension in his mother.  ROS:   Please see the history of present illness.    ROS  All other systems reviewed and negative.   EKGs/Labs/Other Studies Reviewed:    The following studies were reviewed today: Sleep study, PAP compliance download  EKG:  EKG is not ordered today.   Recent  Labs: 11/28/2020: BUN 20; Creatinine, Ser 1.81; Hemoglobin 15.2; Platelets 243; Potassium 3.8; Sodium 137 05/18/2021: ALT 19   Recent Lipid Panel    Component Value Date/Time   CHOL 135 05/18/2021 0850   TRIG 58 05/18/2021 0850   HDL 44 05/18/2021 0850   CHOLHDL 3.1 05/18/2021 0850   CHOLHDL 4.0 11/27/2020 0222   VLDL 31 11/27/2020 0222   LDLCALC 79 05/18/2021 0850    CHA2DS2-VASc Score =   [ ] .  Therefore, the patient's annual risk of stroke is   %.        Physical Exam:    VS:  There were no vitals taken for this visit.    Wt Readings from Last 3 Encounters:  05/18/21 (!) 335 lb (152 kg)  01/05/21 (!) 329 lb 2 oz (149.3  kg)  12/06/20 (!) 326 lb (147.9 kg)     GEN:  Well nourished, well developed in no acute distress HEENT: Normal NECK: No JVD; No carotid bruits LYMPHATICS: No lymphadenopathy CARDIAC: RRR, no murmurs, rubs, gallops RESPIRATORY:  Clear to auscultation without rales, wheezing or rhonchi  ABDOMEN: Soft, non-tender, non-distended MUSCULOSKELETAL:  No edema; No deformity  SKIN: Warm and dry NEUROLOGIC:  Alert and oriented x 3 PSYCHIATRIC:  Normal affect   ASSESSMENT:    1. OSA on CPAP   2. Primary hypertension    PLAN:    In order of problems listed above:  .[facility]   Time Spent: 20 minutes total time of encounter, including 15 minutes spent in face-to-face patient care on the date of this encounter. This time includes coordination of care and counseling regarding above mentioned problem list. Remainder of non-face-to-face time involved reviewing chart documents/testing relevant to the patient encounter and documentation in the medical record. I have independently reviewed documentation from referring provider  Medication Adjustments/Labs and Tests Ordered: Current medicines are reviewed at length with the patient today.  Concerns regarding medicines are outlined above.  No orders of the defined types were placed in this encounter.  No orders of the defined types were placed in this encounter.   Signed, Fransico Him, MD  10/16/2021 4:10 PM    [place]

## 2021-10-17 ENCOUNTER — Ambulatory Visit: Payer: BC Managed Care – PPO | Admitting: Cardiology

## 2021-10-17 DIAGNOSIS — I10 Essential (primary) hypertension: Secondary | ICD-10-CM

## 2021-10-17 DIAGNOSIS — G4733 Obstructive sleep apnea (adult) (pediatric): Secondary | ICD-10-CM

## 2021-11-09 ENCOUNTER — Ambulatory Visit: Payer: BC Managed Care – PPO | Admitting: Medical

## 2021-12-07 ENCOUNTER — Ambulatory Visit: Payer: BC Managed Care – PPO | Admitting: Medical

## 2022-01-04 ENCOUNTER — Ambulatory Visit: Payer: BC Managed Care – PPO | Admitting: Medical

## 2022-01-06 ENCOUNTER — Other Ambulatory Visit: Payer: Self-pay | Admitting: Cardiovascular Disease

## 2022-01-19 DIAGNOSIS — R21 Rash and other nonspecific skin eruption: Secondary | ICD-10-CM | POA: Diagnosis not present

## 2022-02-06 ENCOUNTER — Encounter: Payer: Self-pay | Admitting: Medical

## 2022-02-06 ENCOUNTER — Ambulatory Visit: Payer: BC Managed Care – PPO | Admitting: Medical

## 2022-02-06 VITALS — BP 158/110 | HR 88 | Ht 75.0 in | Wt 335.1 lb

## 2022-02-06 DIAGNOSIS — E785 Hyperlipidemia, unspecified: Secondary | ICD-10-CM | POA: Diagnosis not present

## 2022-02-06 DIAGNOSIS — I5022 Chronic systolic (congestive) heart failure: Secondary | ICD-10-CM

## 2022-02-06 DIAGNOSIS — I10 Essential (primary) hypertension: Secondary | ICD-10-CM

## 2022-02-06 DIAGNOSIS — I420 Dilated cardiomyopathy: Secondary | ICD-10-CM | POA: Diagnosis not present

## 2022-02-06 DIAGNOSIS — E1142 Type 2 diabetes mellitus with diabetic polyneuropathy: Secondary | ICD-10-CM

## 2022-02-06 DIAGNOSIS — I2510 Atherosclerotic heart disease of native coronary artery without angina pectoris: Secondary | ICD-10-CM

## 2022-02-06 DIAGNOSIS — E1169 Type 2 diabetes mellitus with other specified complication: Secondary | ICD-10-CM | POA: Diagnosis not present

## 2022-02-06 DIAGNOSIS — Z794 Long term (current) use of insulin: Secondary | ICD-10-CM

## 2022-02-06 DIAGNOSIS — I429 Cardiomyopathy, unspecified: Secondary | ICD-10-CM

## 2022-02-06 MED ORDER — CARVEDILOL 12.5 MG PO TABS: 12.5000 mg | ORAL_TABLET | Freq: Two times a day (BID) | ORAL | 1 refills | Status: DC

## 2022-02-06 MED ORDER — ISOSORBIDE MONONITRATE ER 30 MG PO TB24: ORAL_TABLET | ORAL | 1 refills | Status: DC

## 2022-02-06 NOTE — Progress Notes (Signed)
Cardiology Office Note:    Date:  02/06/2022   ID:  Ricky Moore, DOB 1976-02-14, MRN [id number]  PCP:  Elisabeth Cara, [place] Cardiologist:  Dr. [facility] Electrophysiologist:  None   Referring MD: Belva Bertin, [place], *   Chief Complaint: 3 month follow-up  History of Present Illness:    Ricky Moore is a 46 y.o. male with a hx of  Cardiomyopathy mixed ICM/NICM, hypertensive heart disease, CAD s/p PCI to the mLAD, CKD stage 3, OSA who presents for 3 month follow-up.   He was initially evaluated on 11/16/20 by Dr. Rockey Situ in the ED for chest pain. Troponin was mildly elevated but exam felt to be consistent with MSK pain. Echo at that time showed LVEF 35-40%. He was discharged home but continued to have chest pain. He was encouraged to go to the ER and reevaluated by our team 11/08/31. He underwent right and left heart cath that showed 90% midLAD stenosis as well as total occlusions of OM1 and RPL2. He underwent successful PCI to the mLAD by Dr. Fletcher Anon.    Seen 01/05/21 and was feeling weak as if he had low sugars, although A1C was much improved. Losartan was changed to [facility].Follow-up echo was ordered reassess EF.   Last seen 05/18/21 and reported chest dullness, but had been walking with no pain, and had made diet changes. BP was high, He was started on Imdur '15mg'$  daily.   Today, the patient is overall doing well. He decreased Lipitor due to "feeling weird" at work. He is taking '40mg'$  daily. He decreased Coreg from '25mg'$ BID to once daily due to feeling tired. Blood pressures have been trending back up. He has not been taking Imdur, he thought it was a steroid. He is walking at the house daily. He plans on starting Raw-food diet. He is also weight lifting. Follow-up cholesterol LDL was 79, he would rather do diet changes than start another medication.   Past Medical History:  Diagnosis Date   Bladder tumor    Complication of anesthesia    SLIGHT NAUSEA    Diabetes mellitus without complication (HCC)    GERD (gastroesophageal reflux disease)    RELATED TO CERTAIN FOODS   Hematuria    Hypertension    Sickle cell trait ([place])    Sleep apnea    USES C PAP    Past Surgical History:  Procedure Laterality Date   CARDIAC CATHETERIZATION     CORONARY STENT INTERVENTION N/A 11/27/2020   Procedure: CORONARY STENT INTERVENTION;  Surgeon: Wellington Hampshire, MD;  Location: [place] [facility];  Service: Cardiovascular;  Laterality: N/A;   CYSTOSCOPY WITH RETROGRADE PYELOGRAM, URETEROSCOPY AND STENT PLACEMENT Bilateral 06/14/2015   Procedure: CYSTOSCOPY WITH BIOPSY, FULGERATION,  BILATERAL RETROGRADE PYELOGRAM;  Surgeon: Alexis Frock, MD;  Location: [facility];  Service: Urology;  Laterality: Bilateral;   RIGHT/LEFT HEART CATH AND CORONARY ANGIOGRAPHY N/A 11/27/2020   Procedure: RIGHT/LEFT HEART CATH AND CORONARY ANGIOGRAPHY;  Surgeon: Wellington Hampshire, MD;  Location: [place] [facility];  Service: Cardiovascular;  Laterality: N/A;   TONSILLECTOMY  2008   TRANSURETHRAL RESECTION OF BLADDER TUMOR WITH GYRUS (TURBT-GYRUS)  2010   VASECTOMY Bilateral 06/14/2015   Procedure: VASECTOMY;  Surgeon: Alexis Frock, MD;  Location: [facility];  Service: Urology;  Laterality: Bilateral;    Current Medications: Current Meds  Medication Sig   amLODipine (NORVASC) 10 MG tablet Take 10 mg by mouth daily.   aspirin 81 MG EC tablet Take 1  tablet (81 mg total) by mouth daily. Swallow whole.   atorvastatin (LIPITOR) 80 MG tablet Take 1 tablet (80 mg total) by mouth daily.   carvedilol (COREG) 12.5 MG tablet Take 1 tablet (12.5 mg total) by mouth 2 (two) times daily.   gabapentin (NEURONTIN) 300 MG capsule Take 300 mg by mouth at bedtime as needed.   isosorbide mononitrate (IMDUR) 30 MG 24 hr tablet Take 1 tablet (30 mg) by mouth once daily   JARDIANCE 25 MG TABS tablet Take 25 mg by mouth daily.   losartan (COZAAR) 100 MG tablet Take by mouth daily.   nitroGLYCERIN  (NITROSTAT) 0.4 MG SL tablet Place 1 tablet (0.4 mg total) under the tongue every 5 (five) minutes x 3 doses as needed for chest pain.   TRULICITY 3 FI/4.3PI SOPN Inject 3 mg into the skin once a week.   [DISCONTINUED] carvedilol (COREG) 25 MG tablet TAKE 1 TABLET BY MOUTH TWICE A DAY   [DISCONTINUED] clopidogrel (PLAVIX) 75 MG tablet TAKE 1 TABLET BY MOUTH DAILY WITH BREAKFAST.   [DISCONTINUED] insulin detemir (LEVEMIR) 100 UNIT/ML injection Inject 30 Units into the skin at bedtime.     Allergies:   Lisinopril, Molds & smuts, and Other   Social History   Socioeconomic History   Marital status: Married    Spouse name: Not on file   Number of children: Not on file   Years of education: Not on file   Highest education level: Not on file  Occupational History   Not on file  Tobacco Use   Smoking status: Never   Smokeless tobacco: Never  Vaping Use   Vaping Use: Never used  Substance and Sexual Activity   Alcohol use: Yes    Alcohol/week: 0.0 standard drinks    Comment: RARE   Drug use: No   Sexual activity: Not on file  Other Topics Concern   Not on file  Social History Narrative   ** Merged History Encounter **       Social Determinants of Health   Financial Resource Strain: Not on file  Food Insecurity: Not on file  Transportation Needs: Not on file  Physical Activity: Not on file  Stress: Not on file  Social Connections: Not on file     Family History: The patient's family history includes Diabetes Mellitus II in his maternal aunt; Hypertension in his mother.  ROS:   Please see the history of present illness.     All other systems reviewed and are negative.  EKGs/Labs/Other Studies Reviewed:    The following studies were reviewed today:  Echo 04/10/21   1. Left ventricular ejection fraction, by estimation, is 45 to 50%. The  left ventricle has mildly decreased function. The left ventricle  demonstrates regional wall motion abnormalities (see scoring   diagram/findings for description). There is mild left  ventricular hypertrophy. Left ventricular diastolic parameters are  consistent with Grade I diastolic dysfunction (impaired relaxation).  Elevated left atrial pressure. There is mild hypokinesis of the left  ventricular, basal-mid lateral wall and  inferolateral wall.   2. Right ventricular systolic function is normal. The right ventricular  size is normal. Tricuspid regurgitation signal is inadequate for assessing  PA pressure.   3. Left atrial size was mildly dilated.   4. The mitral valve is normal in structure. Trivial mitral valve  regurgitation. No evidence of mitral stenosis.   5. The aortic valve was not well visualized. Aortic valve regurgitation  is not visualized. No aortic stenosis  is present.   6. Aortic dilatation noted. There is borderline dilatation of the aortic  root, measuring 39 mm.    R &L Cardiac cath 11/27/20 RPDA lesion is 30% stenosed. 2nd RPL lesion is 100% stenosed. Mid LAD lesion is 90% stenosed. Post intervention, there is a 0% residual stenosis. A drug-eluting stent was successfully placed using a STENT RESOLUTE ONYX 3.5X30. 1st Mrg lesion is 100% stenosed.   1.  Severe one-vessel coronary artery disease with 90% stenosis in the mid LAD.In addition, the patient has small vessel disease affecting RPL 2 and OM1.  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 2.  Right heart catheterization showed mildly elevated filling pressures with pulmonary capillary wedge pressure of 17 mmHg, mild pulmonary hypertension and normal cardiac output. 3.  Successful IVUS and drug-eluting stent placement to the mid LAD.   Recommendations: Dual antiplatelet therapy for at least 6 months. Aggressive treatment of risk factors and blood pressure control. I switch labetalol to carvedilol earlier today. The patient most likely might require a diuretic given elevated PCW/ LVEDP but will have to  make sure his renal function remains stable. A total of 160 mL of contrast was used.   Avoid catheterization via the right radial artery in the future due to a large radial loop that could not be navigated with the wire and catheter.   Coronary Diagrams   Diagnostic Dominance: Right Intervention     EKG:  EKG is  ordered today.  The ekg ordered today demonstrates NSR 88bpm, LAD, q waves septal/inf leads  Recent Labs: 05/18/2021: ALT 19  Recent Lipid Panel    Component Value Date/Time   CHOL 135 05/18/2021 0850   TRIG 58 05/18/2021 0850   HDL 44 05/18/2021 0850   CHOLHDL 3.1 05/18/2021 0850   CHOLHDL 4.0 11/27/2020 0222   VLDL 31 11/27/2020 0222   LDLCALC 79 05/18/2021 0850     Physical Exam:    VS:  BP (!) 158/110 (BP Location: Left Arm, Patient Position: Sitting, Cuff Size: Large)   Pulse 88   Ht '6\' 3"'$  (1.905 m)   Wt (!) 335 lb 2 oz (152 kg)   SpO2 98%   BMI 41.89 kg/m     Wt Readings from Last 3 Encounters:  02/06/22 (!) 335 lb 2 oz (152 kg)  05/18/21 (!) 335 lb (152 kg)  01/05/21 (!) 329 lb 2 oz (149.3 kg)     GEN: 3 Well nourished, well developed in no acute distress HEENT: Normal NECK: No JVD; No carotid bruits LYMPHATICS: No lymphadenopathy CARDIAC: RRR, no murmurs, rubs, gallops RESPIRATORY:  Clear to auscultation without rales, wheezing or rhonchi  ABDOMEN: Soft, non-tender, non-distended MUSCULOSKELETAL:  No edema; No deformity  SKIN: Warm and dry NEUROLOGIC:  Alert and oriented x 3 PSYCHIATRIC:  Normal affect   ASSESSMENT:    1. Coronary artery disease involving native coronary artery of native heart without angina pectoris   2. Hyperlipidemia associated with type 2 diabetes mellitus ([place])   3. Chronic systolic heart failure ([place])   4. Dilated cardiomyopathy ([place])   5. Cardiomyopathy, unspecified type ([place])   6. Essential hypertension   7. Type 2 diabetes mellitus with diabetic polyneuropathy, with long-term current use of insulin (HCC)     PLAN:    In order of problems listed above:  CAD s/p PCI/DES mLAD 10/2020 Patient denies chest pain or SOB. He is walking daily and lifting weights. Plans on starting Raw-food diet. IT  has been over a year since his stent in 10/2021. I will stop Plavix today. Continue Aspirin '81mg'$  daily. Imdur was started at the last visit for chest tightness and elevated BP, however he stopped taking it. Re-start Imdur '15mg'$ , can up-titrate to '30mg'$  daily if he tolerates lower dose. He decreased Lipitor to '40mg'$  daily, he plans on making diet/lifestyle changes. He self-decreased Coreg to 12.'5mg'$ BID due to fatigue. No further ischemic work-up indicated today.   HFrEF Echo from 10/2020 showed LVEF 35-40%. Echo from 04/2021 showed improved LVEF 45-50%, WMA, mild LVH, G1DD. He Is Euvolemic on exam today. Continue Coreg 12.'5mg'$ BID, Jardiance '10mg'$  daily, and Losartan '100mg'$  daily. Can consider addition of spironolactone at follow-up.   DM2 A1C 5.8. Followed by PCP.  HTN BP is high. HE self-decreased BB as above. Continue Coreg 12.'5mg'$ BID. he never started Imdur, I will restart this '15mg'$ , with the plan to titrate to '30mg'$  daily. Continue amlodipine '10mg'$  daily and Losartan '100mg'$  daily. I requested he call in 1 month to reports Bps.   OSA He will reschedule with Dr. Radford Pax. He needs to obtain CPAP  HLD LDL 79 at last re-check. He decreased Lipitor to '40mg'$  daily due to "feeling poroly". He plans on starting [place], he does not want to try another cholesterol medication at this time. If re-check lipids is still above goal, may need Zetia vs PCSK9i.   Disposition: Follow up in 3 month(s) with MD/APP     Signed, Marcin Holte Ninfa Meeker, PA-C  02/06/2022 2:33 PM    [facility]

## 2022-02-06 NOTE — Patient Instructions (Addendum)
Medication Instructions:  - Your physician has recommended you make the following change in your medication:   1) START Imdur (isosorbide) 30 mg: - take 0.5 tablet (15 mg) by mouth once daily x 3-5 days, then if you are feeling ok, - INCREASE to 1 tablet (30 mg) by mouth once daily   2) STOP Plavix (clopidogrel)  Please monitor your blood pressure readings at home and send a list of these in the next 3-4 weeks through your MyChart  *If you need a refill on your cardiac medications before your next appointment, please call your pharmacy*   Lab Work: - none ordered  If you have labs (blood work) drawn today and your tests are completely normal, you will receive your results only by: [place] (if you have MyChart) OR A paper copy in the mail If you have any lab test that is abnormal or we need to change your treatment, we will call you to review the results.   Testing/Procedures: - none ordered   Follow-Up: At [facility], you and your health needs are our priority.  As part of our continuing mission to provide you with exceptional heart care, we have created designated Provider Care Teams.  These Care Teams include your primary Cardiologist (physician) and Advanced Practice Providers (APPs -  Physician Assistants and Nurse Practitioners) who all work together to provide you with the care you need, when you need it.  We recommend signing up for the patient portal called "MyChart".  Sign up information is provided on this After Visit Summary.  MyChart is used to connect with patients for Virtual Visits (Telemedicine).  Patients are able to view lab/test results, encounter notes, upcoming appointments, etc.  Non-urgent messages can be sent to your provider as well.   To learn more about what you can do with MyChart, go to [email or web address].    Your next appointment:   3 month(s)  The format for your next appointment:   In Person  Provider:   You may see Ida Rogue, MD or one of the following Advanced Practice Providers on your designated Care Team:    Cadence Kathlen Mody, [place]    Other Instructions  Isosorbide Mononitrate Extended-Release Tablets What is this medication? ISOSORBIDE MONONITRATE (eye soe SOR bide mon oh NYE trate) prevents chest pain (angina). It works by relaxing blood vessels, which decreases the amount of work the heart has to do. It belongs to a group of medications called nitrates. Do not use it to treat sudden chest pain. This medicine may be used for other purposes; ask your health care provider or pharmacist if you have questions. COMMON BRAND NAME(S): Imdur, Isotrate ER What should I tell my care team before I take this medication? They need to know if you have any of these conditions: Previous heart attack or heart failure An unusual or allergic reaction to isosorbide mononitrate, nitrates, other medications, foods, dyes, or preservatives Pregnant or trying to get pregnant Breast-feeding How should I use this medication? Take this medication by mouth with a glass of water. Follow the directions on the prescription label. Do not crush or chew. Take your medication at regular intervals. Do not take your medication more often than directed. Do not stop taking this medication except on the advice of your care team. Talk to your care team about the use of this medication in children. Special care may be needed. Overdosage: If you think you have taken too much of this medicine contact a poison control center  or emergency room at once. NOTE: This medicine is only for you. Do not share this medicine with others. What if I miss a dose? If you miss a dose, take it as soon as you can. If it is almost time for your next dose, take only that dose. Do not take double or extra doses. What may interact with this medication? Do not take this medication with any of the following: Medications used to treat erectile dysfunction (ED) like  avanafil, sildenafil, tadalafil, and vardenafil [place] This medication may also interact with the following: Medications for high blood pressure Other medications for angina or heart failure This list may not describe all possible interactions. Give your health care provider a list of all the medicines, herbs, non-prescription drugs, or dietary supplements you use. Also tell them if you smoke, drink alcohol, or use illegal drugs. Some items may interact with your medicine. What should I watch for while using this medication? Check your heart rate and blood pressure regularly while you are taking this medication. Ask your care team what your heart rate and blood pressure should be and when you should contact him or her. Tell your care team if you feel your medication is no longer working. You may get dizzy. Do not drive, use machinery, or do anything that needs mental alertness until you know how this medication affects you. To reduce the risk of dizzy or fainting spells, do not sit or stand up quickly, especially if you are an older patient. Alcohol can make you more dizzy, and increase flushing and rapid heartbeats. Avoid alcoholic drinks. Do not treat yourself for coughs, colds, or pain while you are taking this medication without asking your care team for advice. Some ingredients may increase your blood pressure. What side effects may I notice from receiving this medication? Side effects that you should report to your care team as soon as possible: Allergic reactions--skin rash, itching, hives, swelling of the face, lips, tongue, or throat Headache, unusual weakness or fatigue, shortness of breath, nausea, vomiting, rapid heartbeat, blue skin or lips, which may be signs of methemoglobinemia Increased pressure around the brain--severe headache, blurry vision, change in vision, nausea, vomiting Low blood pressure--dizziness, feeling faint or lightheaded, blurry vision Slow heartbeat--dizziness,  feeling faint or lightheaded, confusion, trouble breathing, unusual weakness or fatigue Worsening chest pain (angina)--pain, pressure, or tightness in the chest, neck, back, or arms Side effects that usually do not require medical attention (report to your care team if they continue or are bothersome): Dizziness Flushing Headache This list may not describe all possible side effects. Call your doctor for medical advice about side effects. You may report side effects to FDA at 1-800-FDA-1088. Where should I keep my medication? Keep out of the reach of children. Store between 15 and 30 degrees C (59 and 86 degrees F). Keep container tightly closed. Throw away any unused medication after the expiration date. NOTE: This sheet is a summary. It may not cover all possible information. If you have questions about this medicine, talk to your doctor, pharmacist, or health care provider.  2023 Elsevier/Gold Standard (2021-01-03 00:00:00)   Important Information About Sugar

## 2022-02-18 DIAGNOSIS — N3080 Other cystitis without hematuria: Secondary | ICD-10-CM | POA: Diagnosis not present

## 2022-04-04 ENCOUNTER — Other Ambulatory Visit: Payer: Self-pay | Admitting: Cardiovascular Disease

## 2022-05-10 ENCOUNTER — Ambulatory Visit: Payer: BC Managed Care – PPO | Admitting: Cardiovascular Disease

## 2022-06-05 DIAGNOSIS — Z794 Long term (current) use of insulin: Secondary | ICD-10-CM | POA: Diagnosis not present

## 2022-06-05 DIAGNOSIS — E1122 Type 2 diabetes mellitus with diabetic chronic kidney disease: Secondary | ICD-10-CM | POA: Diagnosis not present

## 2022-06-05 DIAGNOSIS — Z7984 Long term (current) use of oral hypoglycemic drugs: Secondary | ICD-10-CM | POA: Diagnosis not present

## 2022-06-05 DIAGNOSIS — N1831 Chronic kidney disease, stage 3a: Secondary | ICD-10-CM | POA: Diagnosis not present

## 2022-06-05 DIAGNOSIS — Z7985 Long-term (current) use of injectable non-insulin antidiabetic drugs: Secondary | ICD-10-CM | POA: Diagnosis not present

## 2022-06-05 DIAGNOSIS — I129 Hypertensive chronic kidney disease with stage 1 through stage 4 chronic kidney disease, or unspecified chronic kidney disease: Secondary | ICD-10-CM | POA: Diagnosis not present

## 2022-06-05 DIAGNOSIS — I10 Essential (primary) hypertension: Secondary | ICD-10-CM | POA: Diagnosis not present

## 2022-06-05 DIAGNOSIS — E785 Hyperlipidemia, unspecified: Secondary | ICD-10-CM | POA: Diagnosis not present

## 2022-06-05 DIAGNOSIS — E1169 Type 2 diabetes mellitus with other specified complication: Secondary | ICD-10-CM | POA: Diagnosis not present

## 2022-07-07 ENCOUNTER — Other Ambulatory Visit: Payer: Self-pay | Admitting: Cardiovascular Disease

## 2022-07-08 NOTE — Telephone Encounter (Signed)
Per his last visit with Cadence in June, she documented: HLD LDL 79 at last re-check. He decreased Lipitor to '40mg'$  daily due to "feeling poroly". He plans on starting [place], he does not want to try another cholesterol medication at this time. If re-check lipids is still above goal, may need Zetia vs PCSK9i.    Would refill atorvastatin at 40 mg once daily.  Thank you!

## 2022-07-08 NOTE — Telephone Encounter (Signed)
Please advise which dose of atorvastatin patient should be taking-40 or 80 mg. He has two doses on med list. It looks like patient may have cut his dose in half per last office note. Thank you!

## 2022-08-02 ENCOUNTER — Ambulatory Visit: Payer: BC Managed Care – PPO | Admitting: Cardiovascular Disease

## 2022-08-02 ENCOUNTER — Other Ambulatory Visit: Payer: Self-pay | Admitting: Medical

## 2022-08-10 ENCOUNTER — Other Ambulatory Visit: Payer: Self-pay | Admitting: Medical

## 2022-08-27 ENCOUNTER — Ambulatory Visit: Payer: BC Managed Care – PPO | Admitting: Cardiovascular Disease

## 2022-09-12 DIAGNOSIS — K5900 Constipation, unspecified: Secondary | ICD-10-CM | POA: Diagnosis not present

## 2022-09-12 DIAGNOSIS — Z1211 Encounter for screening for malignant neoplasm of colon: Secondary | ICD-10-CM | POA: Diagnosis not present

## 2022-09-12 DIAGNOSIS — Z8679 Personal history of other diseases of the circulatory system: Secondary | ICD-10-CM | POA: Diagnosis not present

## 2022-09-30 NOTE — Progress Notes (Unsigned)
Cardiology Office Note  Date:  10/01/2022   ID:  Ricky Moore, Ricky Moore August 28, 1976, MRN [id number]  PCP:  Ricky Cara, PA-C   Chief Complaint  Patient presents with   3 month follow up     Patient c/o elevated blood pressure, nausea at times & headaches; patient loss his brother recently due to septic complications due to surgery. Medications reviewed by the patient verbally.      HPI:  Ricky Moore is a 47 y.o. male  with past medical history of Cardiomyopathy, hypertensive heart disease Coronary artery disease, Chronic kidney disease stage III emergency room November 16, 2020  atypical chest pain Echocardiogram ejection fraction 35 to 40% chest pain at home, reevaluated November 27, 2020, right left heart catheterization 90% mid LAD stenosis with total occlusions of OM1, posterolateral branch #2, underwent PCI of the mid LAD Echo 8/22: EF 45 to 50% Who presents for routine follow-up in the clinic for his coronary disease, cardiomyopathy  Last seen by myself in the clinic May 2022 Previously seen by Dr. Saunders Revel in clinic December 06, 2020 Seen by one of our providers June 2023 On that visit was taking Coreg 25 mg once a day, was not on his isosorbide  Blood pressure on that visit was still elevated Coreg up to 25 twice daily Reports history of dizziness on Entresto  Follow-up today he reports having some headaches, rare nausea Blood pressures have been running high Reports compliance with his medications No chest pain, no SOB No regular exercise program  Lab work reviewed Total cholesterol 140 LDL 90 Creatinine 1.52 A1c 6.6  EKG personally reviewed by myself on todays visit Shows normal sinus rhythm rate 79 bpm no significant ST-T wave changes   PMH:   has a past medical history of Bladder tumor, Complication of anesthesia, Diabetes mellitus without complication ([place]), GERD (gastroesophageal reflux disease), Hematuria, Hypertension, Sickle cell trait ([place]), and Sleep  apnea.  PSH:    Past Surgical History:  Procedure Laterality Date   CARDIAC CATHETERIZATION     CORONARY STENT INTERVENTION N/A 11/27/2020   Procedure: CORONARY STENT INTERVENTION;  Surgeon: Wellington Hampshire, MD;  Location: [place] [facility];  Service: Cardiovascular;  Laterality: N/A;   CYSTOSCOPY WITH RETROGRADE PYELOGRAM, URETEROSCOPY AND STENT PLACEMENT Bilateral 06/14/2015   Procedure: CYSTOSCOPY WITH BIOPSY, FULGERATION,  BILATERAL RETROGRADE PYELOGRAM;  Surgeon: Alexis Frock, MD;  Location: [facility];  Service: Urology;  Laterality: Bilateral;   RIGHT/LEFT HEART CATH AND CORONARY ANGIOGRAPHY N/A 11/27/2020   Procedure: RIGHT/LEFT HEART CATH AND CORONARY ANGIOGRAPHY;  Surgeon: Wellington Hampshire, MD;  Location: [facility];  Service: Cardiovascular;  Laterality: N/A;   TONSILLECTOMY  2008   TRANSURETHRAL RESECTION OF BLADDER TUMOR WITH GYRUS (TURBT-GYRUS)  2010   VASECTOMY Bilateral 06/14/2015   Procedure: VASECTOMY;  Surgeon: Alexis Frock, MD;  [facility];  Service: Urology;  Laterality: Bilateral;    Current Outpatient Medications  Medication Sig Dispense Refill   amLODipine (NORVASC) 10 MG tablet Take 10 mg by mouth daily.     aspirin 81 MG EC tablet Take 1 tablet (81 mg total) by mouth daily. Swallow whole. 30 tablet 10   atorvastatin (LIPITOR) 40 MG tablet TAKE 1 TABLET BY MOUTH EVERY DAY 90 tablet 3   carvedilol (COREG) 12.5 MG tablet TAKE 1 TABLET BY MOUTH 2 TIMES DAILY. 180 tablet 1   gabapentin (NEURONTIN) 300 MG capsule Take 300 mg by mouth at bedtime as needed.     isosorbide mononitrate (IMDUR)  30 MG 24 hr tablet Take 1 tablet (30 mg total) by mouth daily. 90 tablet 1   JARDIANCE 25 MG TABS tablet Take 25 mg by mouth daily.     losartan (COZAAR) 100 MG tablet Take by mouth daily.     nitroGLYCERIN (NITROSTAT) 0.4 MG SL tablet Place 1 tablet (0.4 mg total) under the tongue every 5 (five) minutes x 3 doses as needed for chest pain. 15 tablet 12    TRULICITY 3 AQ/7.6AU SOPN Inject 3 mg into the skin once a week.     No current facility-administered medications for this visit.    Allergies:   Lisinopril, Molds & smuts, and Other   Social History:  The patient  reports that he has never smoked. He has never used smokeless tobacco. He reports current alcohol use. He reports that he does not use drugs.   Family History:   family history includes Diabetes Mellitus II in his maternal aunt; Hypertension in his mother.    Review of Systems: Review of Systems  Constitutional: Negative.   HENT: Negative.    Respiratory: Negative.    Cardiovascular: Negative.   Gastrointestinal: Negative.   Musculoskeletal: Negative.   Neurological: Negative.   Psychiatric/Behavioral: Negative.    All other systems reviewed and are negative.   PHYSICAL EXAM: VS:  BP (!) 144/100 (BP Location: Left Arm, Patient Position: Sitting, Cuff Size: Large)   Pulse 79   Ht '6\' 3"'$  (1.905 m)   Wt (!) 338 lb 2 oz (153.4 kg)   SpO2 98%   BMI 42.26 kg/m  , BMI Body mass index is 42.26 kg/m. Constitutional:  oriented to person, place, and time. No distress.  HENT:  Head: Grossly normal Eyes:  no discharge. No scleral icterus.  Neck: No JVD, no carotid bruits  Cardiovascular: Regular rate and rhythm, no murmurs appreciated Pulmonary/Chest: Clear to auscultation bilaterally, no wheezes or rails Abdominal: Soft.  no distension.  no tenderness.  Musculoskeletal: Normal range of motion Neurological:  normal muscle tone. Coordination normal. No atrophy Skin: Skin warm and dry Psychiatric: normal affect, pleasant  Recent Labs: No results found for requested labs within last 365 days.    Lipid Panel Lab Results  Component Value Date   CHOL 135 05/18/2021   HDL 44 05/18/2021   LDLCALC 79 05/18/2021   TRIG 58 05/18/2021      Wt Readings from Last 3 Encounters:  10/01/22 (!) 338 lb 2 oz (153.4 kg)  02/06/22 (!) 335 lb 2 oz (152 kg)  05/18/21 (!) 335 lb  (152 kg)     ASSESSMENT AND PLAN:  Problem List Items Addressed This Visit       Cardiology Problems   Dilated cardiomyopathy ([place]) - Primary   Hyperlipidemia associated with type 2 diabetes mellitus ([place])   Chronic systolic heart failure (HCC)     Other   Morbid obesity ([place])   Type 2 diabetes mellitus with diabetic polyneuropathy, with long-term current use of insulin (HCC)   OSA on CPAP   Other Visit Diagnoses     Coronary artery disease involving native coronary artery of native heart without angina pectoris         Cardiomyopathy Long discussion concerning various treatment options for blood pressure Will recommend he double his carvedilol up to 25 twice daily If unable to tolerate or blood pressure continues to run high we will increase isosorbide up to 30 twice daily We did discuss adding hydralazine but he prefers fewer prescriptions We also  discussed previous trial of changing losartan to [facility] but he stopped the medication and reported dizziness in 2022 Appears relatively euvolemic Continue Jardiance  Diabetes type 2 with complications X3K improved and stable Weight loss and exercise program recommended  Essential hypertension Management as above, carvedilol up to 25 twice daily  Coronary disease with stable angina On today's visit reports he is not on aspirin, was told by endocrinology this would hurt his kidneys We have recommended he go back on aspirin 81 mg daily or he would need Plavix prescription.  He will start aspirin 81 and discussed with endocrinology and follow-up    Total encounter time more than 30 minutes  Greater than 50% was spent in counseling and coordination of care with the patient    Signed, Esmond Plants, M.D., Ph.D. [place], [place]

## 2022-10-01 ENCOUNTER — Ambulatory Visit: Payer: BC Managed Care – PPO | Attending: Cardiovascular Disease | Admitting: Cardiovascular Disease

## 2022-10-01 ENCOUNTER — Encounter: Payer: Self-pay | Admitting: Cardiovascular Disease

## 2022-10-01 VITALS — BP 144/100 | HR 79 | Ht 75.0 in | Wt 338.1 lb

## 2022-10-01 DIAGNOSIS — I5022 Chronic systolic (congestive) heart failure: Secondary | ICD-10-CM | POA: Diagnosis not present

## 2022-10-01 DIAGNOSIS — E1169 Type 2 diabetes mellitus with other specified complication: Secondary | ICD-10-CM | POA: Diagnosis not present

## 2022-10-01 DIAGNOSIS — E785 Hyperlipidemia, unspecified: Secondary | ICD-10-CM

## 2022-10-01 DIAGNOSIS — Z794 Long term (current) use of insulin: Secondary | ICD-10-CM

## 2022-10-01 DIAGNOSIS — I2510 Atherosclerotic heart disease of native coronary artery without angina pectoris: Secondary | ICD-10-CM | POA: Diagnosis not present

## 2022-10-01 DIAGNOSIS — I420 Dilated cardiomyopathy: Secondary | ICD-10-CM

## 2022-10-01 DIAGNOSIS — E1142 Type 2 diabetes mellitus with diabetic polyneuropathy: Secondary | ICD-10-CM

## 2022-10-01 DIAGNOSIS — G4733 Obstructive sleep apnea (adult) (pediatric): Secondary | ICD-10-CM

## 2022-10-01 MED ORDER — CARVEDILOL 25 MG PO TABS: 25.0000 mg | ORAL_TABLET | Freq: Two times a day (BID) | ORAL | 3 refills | Status: DC

## 2022-10-01 NOTE — Patient Instructions (Addendum)
Medication Instructions:  Please increase the coreg up to 25 twice a day If blood pressure continues to run high, We may need to increase the imdur up to 30 mg twice a day  Call if you would like zetia for cholesterol  If you need a refill on your cardiac medications before your next appointment, please call your pharmacy.   Lab work: No new labs needed  Testing/Procedures: No new testing needed  Follow-Up: At [facility], you and your health needs are our priority.  As part of our continuing mission to provide you with exceptional heart care, we have created designated Provider Care Teams.  These Care Teams include your primary Cardiologist (physician) and Advanced Practice Providers (APPs -  Physician Assistants and Nurse Practitioners) who all work together to provide you with the care you need, when you need it.  You will need a follow up appointment in 12 months  Providers on your designated Care Team:   Murray Hodgkins, NP Christell Faith, PA-C Cadence Kathlen Mody, [place]  COVID-19 Vaccine Information can be found at: [email or web address].uk For questions related to vaccine distribution or appointments, please email [email or web address] or call [phone].

## 2022-10-03 DIAGNOSIS — K635 Polyp of colon: Secondary | ICD-10-CM | POA: Diagnosis not present

## 2022-10-03 DIAGNOSIS — K648 Other hemorrhoids: Secondary | ICD-10-CM | POA: Diagnosis not present

## 2022-10-03 DIAGNOSIS — Z1211 Encounter for screening for malignant neoplasm of colon: Secondary | ICD-10-CM | POA: Diagnosis not present

## 2022-10-04 IMAGING — CT CT ANGIO CHEST-ABD-PELV FOR DISSECTION W/ AND WO/W CM
2 of 7 series · 14 of 46 positions shown, 16 images · non-contrast
Comparison: 11/15/2020

CLINICAL DATA: Chest pain radiating to back for 1 week

EXAM:
CT ANGIOGRAPHY CHEST, ABDOMEN AND PELVIS
TECHNIQUE: Non-contrast CT of the chest was initially obtained.

[Series 5: axial arterial · axial · arterial · 0.98mm/px · z∈[-1112,-500]mm · 11 of 236 slices shown, 13 images]
[im 16/236  soft-tissue]
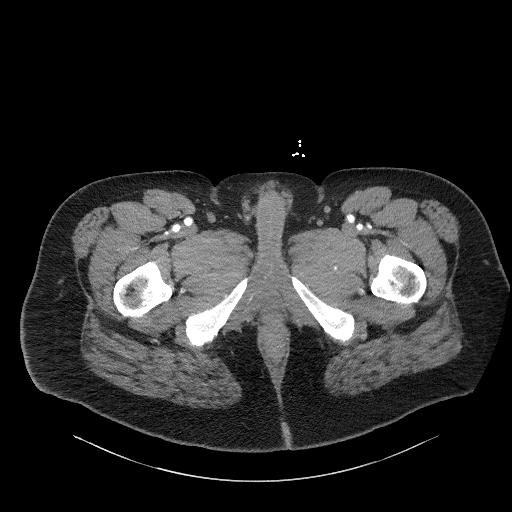
[im 16/236  bone]
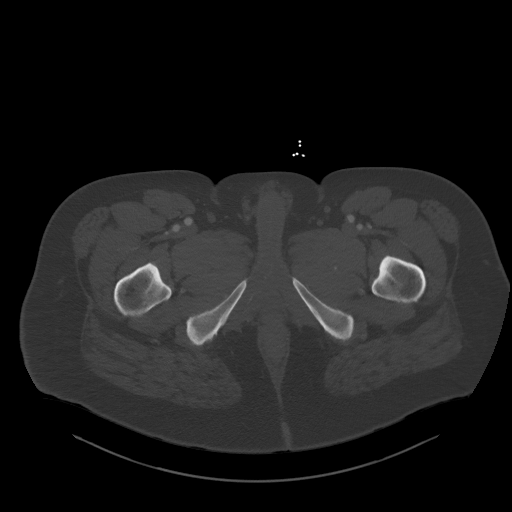
[im 32/236  soft-tissue]
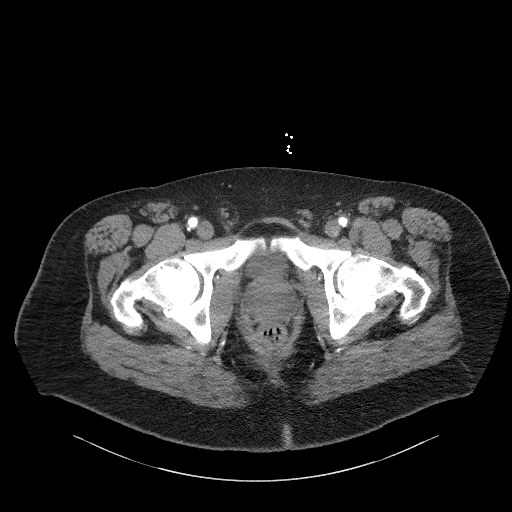
[im 63/236  soft-tissue]
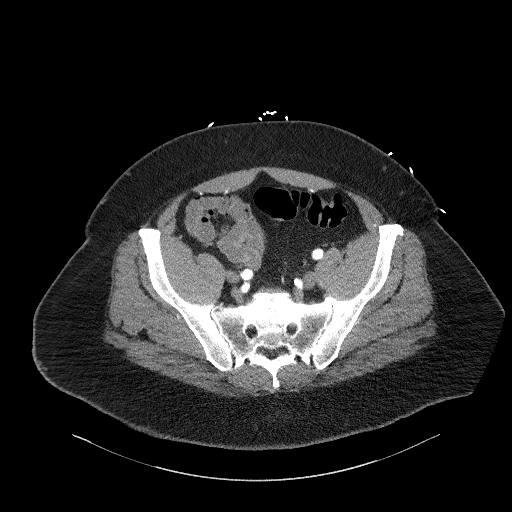
[im 79/236  soft-tissue]
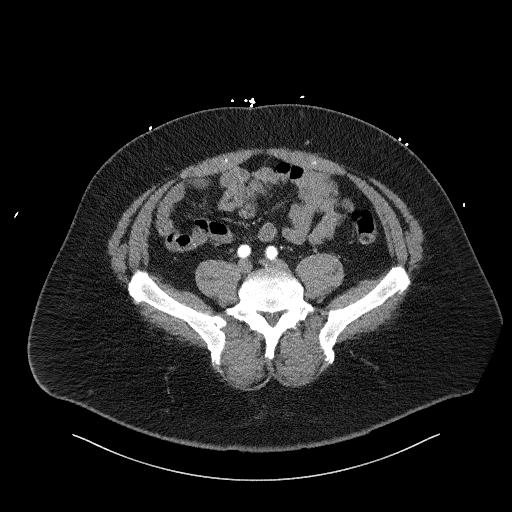
[im 95/236  soft-tissue]
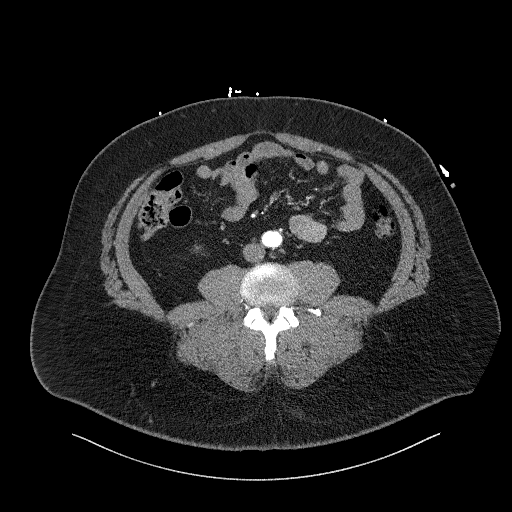
[im 126/236  soft-tissue]
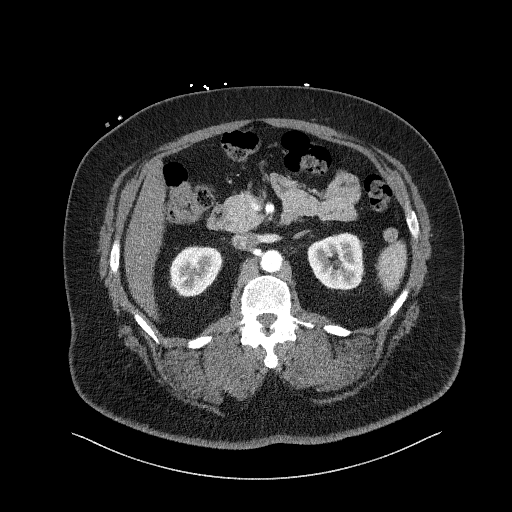
[im 142/236  soft-tissue]
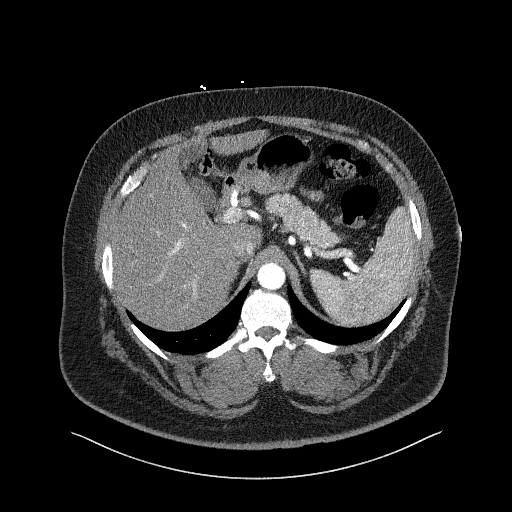
[im 157/236  soft-tissue]
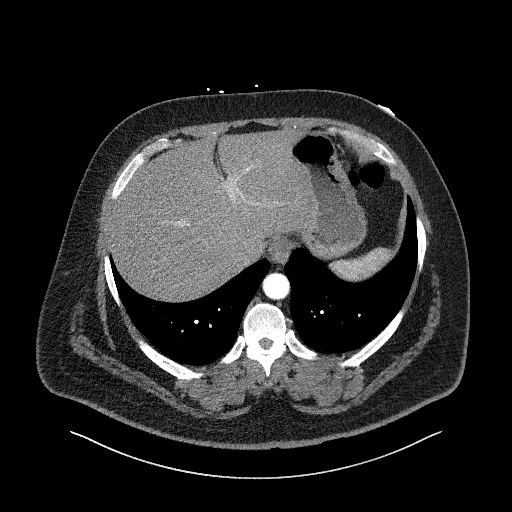
[im 173/236  soft-tissue]
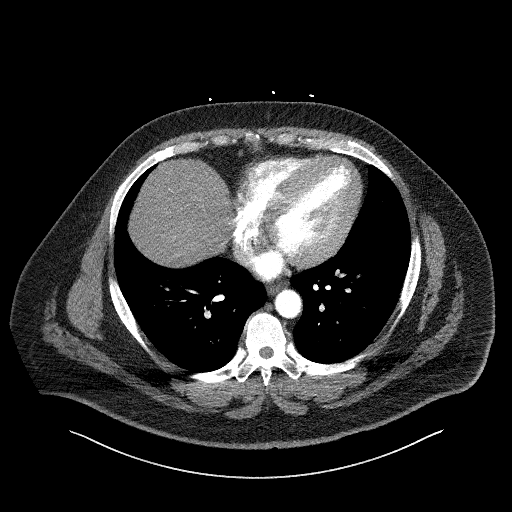
[im 173/236  bone]
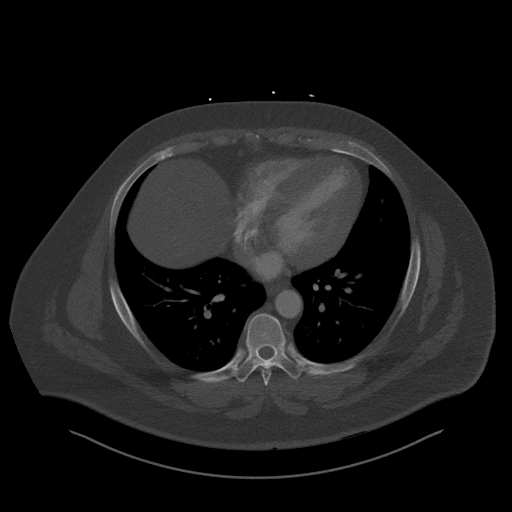
[im 204/236  soft-tissue]
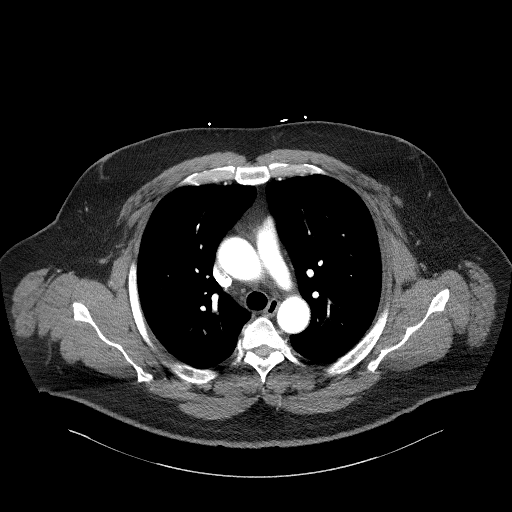
[im 220/236  soft-tissue]
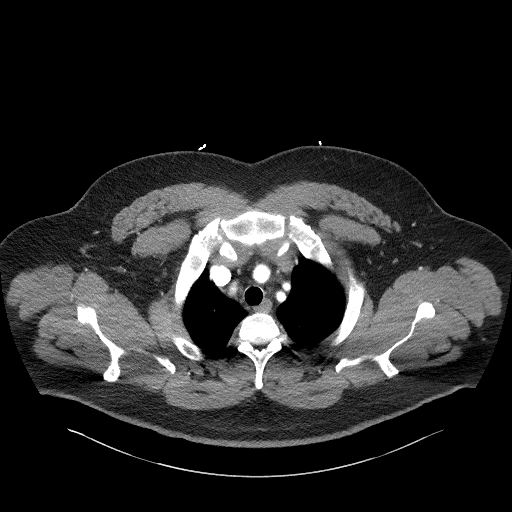

[Series 7: coronals · coronal · 0.86mm/px · 3 of 173 slices shown]
[im 44/173  soft-tissue]
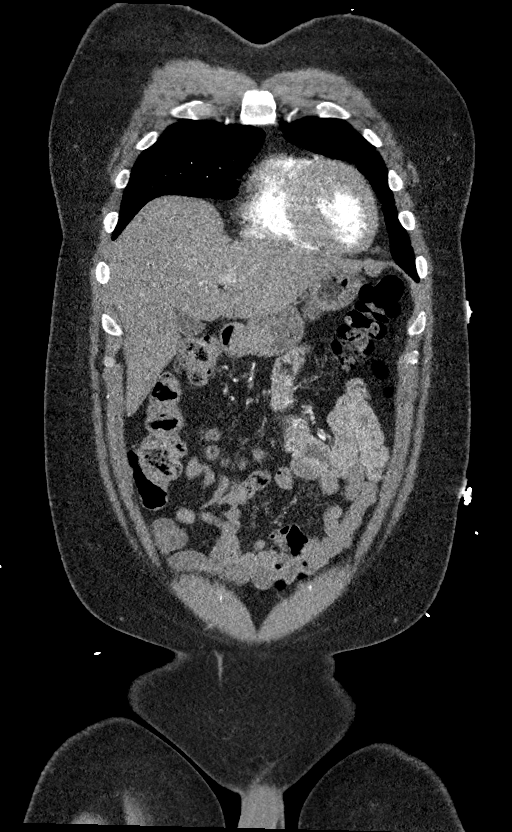
[im 87/173  soft-tissue]
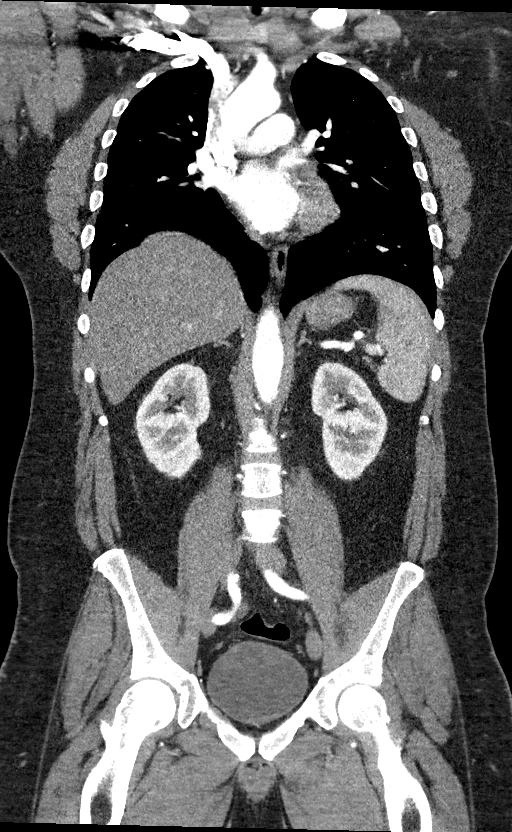
[im 130/173  soft-tissue]
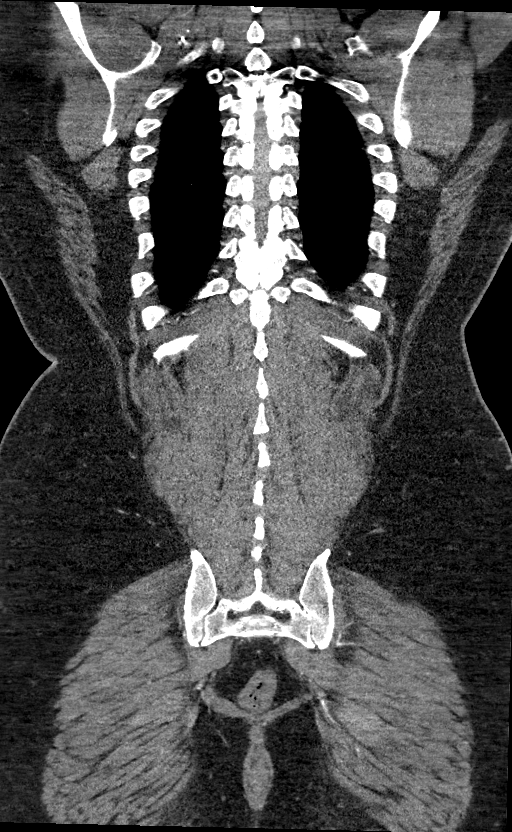

[14 of 46 positions shown; findings below may reference images not displayed]

Multidetector CT imaging through the chest, abdomen and pelvis was
performed using the standard protocol during bolus administration of
intravenous contrast. Multiplanar reconstructed images and MIPs were
obtained and reviewed to evaluate the vascular anatomy.

CONTRAST:  125mL OMNIPAQUE IOHEXOL 350 MG/ML SOLN
FINDINGS: CTA CHEST FINDINGS

Cardiovascular: Thoracic aorta is unremarkable with no aneurysm or
dissection. Bovine arch incidentally noted.

Mild left ventricular dilatation without frank cardiomegaly. No
pericardial effusion. Coronary artery atherosclerosis most
pronounced within the right coronary and LAD distribution.

Mediastinum/Nodes: No enlarged mediastinal, hilar, or axillary lymph
nodes. Thyroid gland, trachea, and esophagus demonstrate no
significant findings.

Lungs/Pleura: No airspace disease, effusion, or pneumothorax.
Central airways are patent.

Musculoskeletal: No acute or destructive bony lesions. Reconstructed
images demonstrate no additional findings.

Review of the MIP images confirms the above findings.

CTA ABDOMEN AND PELVIS FINDINGS

VASCULAR

Aorta: Normal caliber aorta without aneurysm, dissection, vasculitis
or significant stenosis.

Celiac: Patent without evidence of aneurysm, dissection, vasculitis
or significant stenosis.

SMA: Patent without evidence of aneurysm, dissection, vasculitis or
significant stenosis.

Renals: Both renal arteries are patent without evidence of aneurysm,
dissection, vasculitis, fibromuscular dysplasia or significant
stenosis.

IMA: Patent without evidence of aneurysm, dissection, vasculitis or
significant stenosis.

Inflow: Patent without evidence of aneurysm, dissection, vasculitis
or significant stenosis. Mild atherosclerosis of the bilateral
internal iliac arteries.

Veins: No obvious venous abnormality within the limitations of this
arterial phase study.

Review of the MIP images confirms the above findings.

NON-VASCULAR

Hepatobiliary: Diffuse hepatic steatosis without focal abnormality.
No evidence of cholelithiasis or cholecystitis. No biliary dilation.

Pancreas: Unremarkable. No pancreatic ductal dilatation or
surrounding inflammatory changes.

Spleen: Normal in size without focal abnormality.

Adrenals/Urinary Tract: Adrenal glands are unremarkable. Kidneys are
normal, without renal calculi, focal lesion, or hydronephrosis.
Bladder is unremarkable.

Stomach/Bowel: No bowel obstruction or ileus. Normal appendix right
lower quadrant. No bowel wall thickening or inflammatory change.

Lymphatic: No pathologic adenopathy within the abdomen or pelvis.

Reproductive: Prostate is unremarkable.

Other: No free fluid or free gas.  No abdominal wall hernia.

Musculoskeletal: No acute or destructive bony lesions. Reconstructed
images demonstrate no additional findings.

Review of the MIP images confirms the above findings.
IMPRESSION: 1. No evidence of thoracoabdominal aortic aneurysm or dissection.
2. Moderate coronary artery atherosclerosis greatest in the right
coronary and LAD distributions.
3. No acute intrathoracic, intra-abdominal, or intrapelvic process.
4. Hepatic steatosis.

## 2022-10-04 IMAGING — CR DG CHEST 2V
1 series · 2 of 2 positions shown · non-contrast
Comparison: 03/08/2016

CLINICAL DATA: Chest pain.

EXAM:
CHEST - 2 VIEW

[Series 1: dg chest 2 view · 0.14mm/px · 2 of 2 slices shown]
[im 1/2]
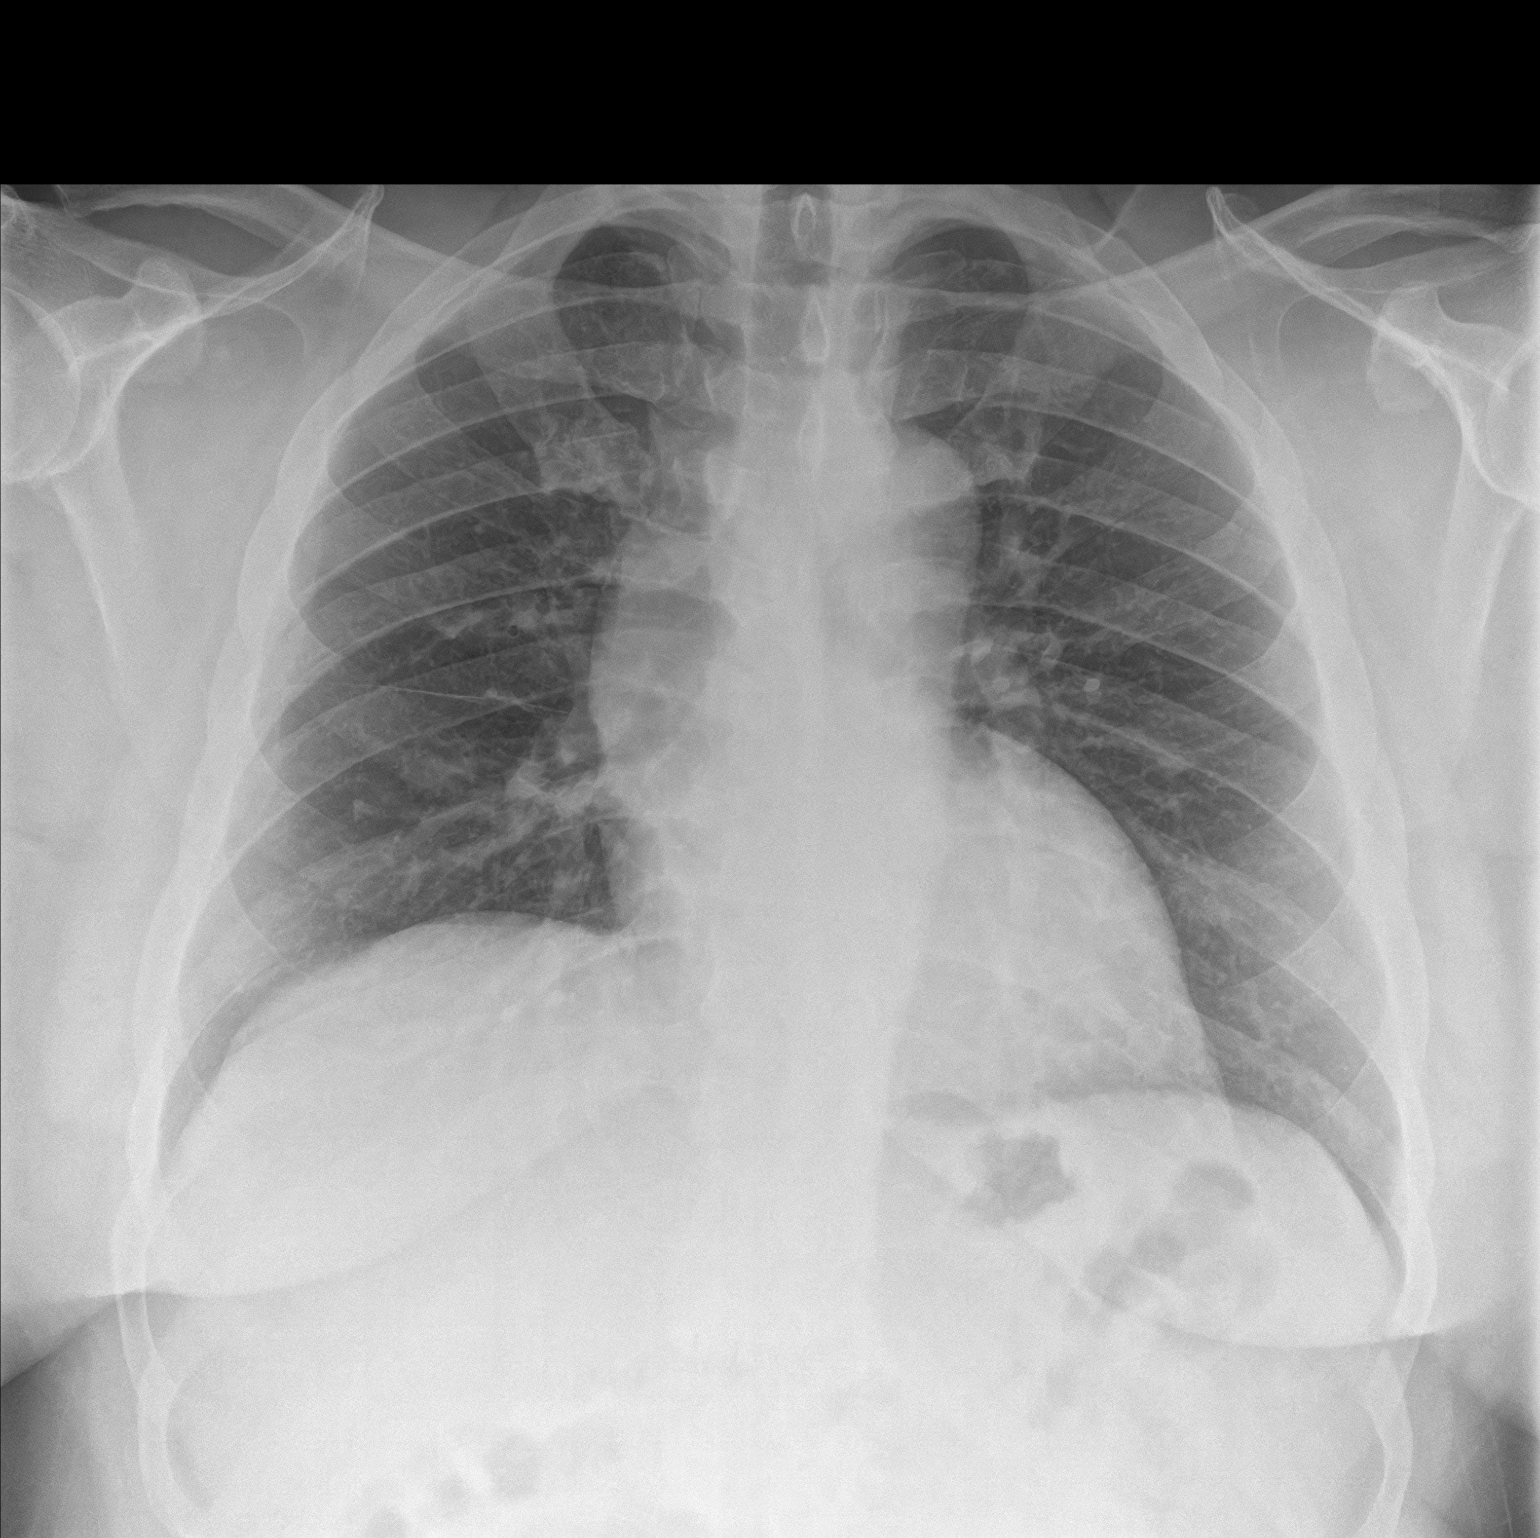
[im 2/2]
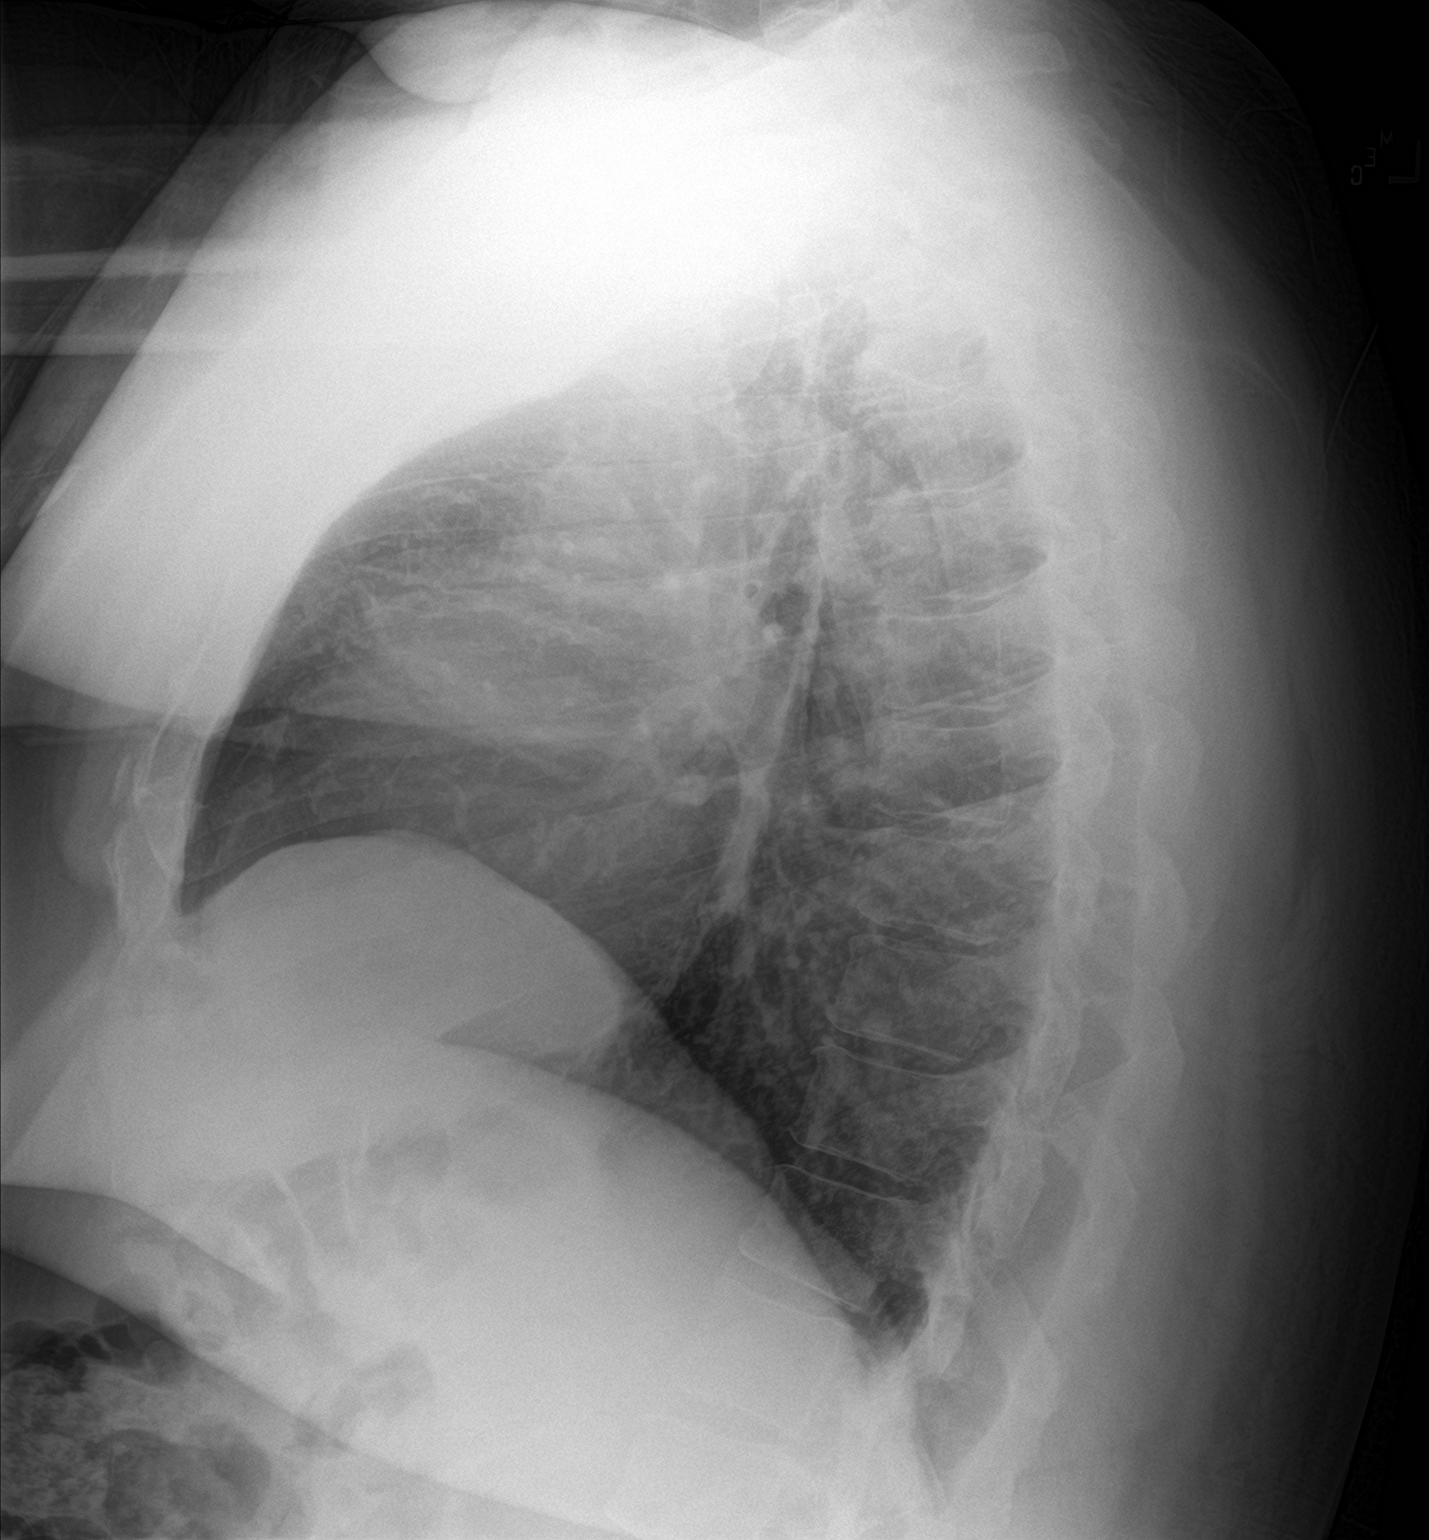

[2 of 2 positions shown; findings below may reference images not displayed]

FINDINGS: Chronic elevation of right hemidiaphragm. Stable heart size and
mediastinal contours. No focal airspace disease. No pleural effusion
or pneumothorax. No pulmonary edema. No acute osseous abnormalities
are seen.
IMPRESSION: No acute chest findings.

## 2022-10-07 ENCOUNTER — Encounter: Payer: Self-pay | Admitting: Cardiovascular Disease

## 2022-10-11 ENCOUNTER — Telehealth: Payer: Self-pay | Admitting: Cardiovascular Disease

## 2022-10-11 NOTE — Telephone Encounter (Signed)
Pt is calling to inquire on FMLA forms he sent through [employer]. Requesting return call for update.

## 2022-10-15 ENCOUNTER — Telehealth: Payer: Self-pay | Admitting: Cardiovascular Disease

## 2022-10-15 DIAGNOSIS — Z0279 Encounter for issue of other medical certificate: Secondary | ICD-10-CM

## 2022-10-15 NOTE — Telephone Encounter (Signed)
Patient is calling back due to not hearing back regarding his FMLA forms. Spoke with Gabriel Cirri who said she will call patient back. Patient verbalized understanding.

## 2022-10-15 NOTE — Telephone Encounter (Signed)
Received FMLA forms from [employer] via [employer]. Awaiting patient to come and sign release forms and collect $29. Forms left up front for patient to sign.

## 2022-10-15 NOTE — Telephone Encounter (Signed)
Patient came in signed forms & paid $29 cash.

## 2022-10-17 NOTE — Telephone Encounter (Signed)
Scanned signed release form, billing form, and blank [employer] forms into chart. Placed in Dr. Donivan Scull  nurse box.

## 2022-10-18 NOTE — Telephone Encounter (Signed)
Forms completed and signed by provider and faxed to [facility]

## 2022-10-18 NOTE — Telephone Encounter (Signed)
Patient called and would like to know if the forms can be signed today. He states the time from for the forms for his work has already passed. I assured him I would pass the message along to Dr. Rockey Situ.

## 2023-01-06 ENCOUNTER — Other Ambulatory Visit: Payer: Self-pay | Admitting: Medical

## 2023-03-20 DIAGNOSIS — Z7985 Long-term (current) use of injectable non-insulin antidiabetic drugs: Secondary | ICD-10-CM | POA: Diagnosis not present

## 2023-03-20 DIAGNOSIS — Z7984 Long term (current) use of oral hypoglycemic drugs: Secondary | ICD-10-CM | POA: Diagnosis not present

## 2023-03-20 DIAGNOSIS — E1122 Type 2 diabetes mellitus with diabetic chronic kidney disease: Secondary | ICD-10-CM | POA: Diagnosis not present

## 2023-03-20 DIAGNOSIS — N1831 Chronic kidney disease, stage 3a: Secondary | ICD-10-CM | POA: Diagnosis not present

## 2023-09-04 ENCOUNTER — Other Ambulatory Visit: Payer: Self-pay | Admitting: Cardiovascular Disease

## 2023-10-30 ENCOUNTER — Ambulatory Visit: Payer: BC Managed Care – PPO | Admitting: Nurse Practitioner

## 2024-02-05 DIAGNOSIS — E782 Mixed hyperlipidemia: Secondary | ICD-10-CM | POA: Diagnosis not present

## 2024-02-05 DIAGNOSIS — E7800 Pure hypercholesterolemia, unspecified: Secondary | ICD-10-CM | POA: Diagnosis not present

## 2024-02-05 DIAGNOSIS — N1831 Chronic kidney disease, stage 3a: Secondary | ICD-10-CM | POA: Diagnosis not present

## 2024-02-05 DIAGNOSIS — E1122 Type 2 diabetes mellitus with diabetic chronic kidney disease: Secondary | ICD-10-CM | POA: Diagnosis not present

## 2024-02-05 DIAGNOSIS — I129 Hypertensive chronic kidney disease with stage 1 through stage 4 chronic kidney disease, or unspecified chronic kidney disease: Secondary | ICD-10-CM | POA: Diagnosis not present

## 2024-02-05 DIAGNOSIS — Z794 Long term (current) use of insulin: Secondary | ICD-10-CM | POA: Diagnosis not present

## 2024-02-09 DIAGNOSIS — E1122 Type 2 diabetes mellitus with diabetic chronic kidney disease: Secondary | ICD-10-CM | POA: Diagnosis not present

## 2024-02-09 DIAGNOSIS — N1831 Chronic kidney disease, stage 3a: Secondary | ICD-10-CM | POA: Diagnosis not present

## 2024-02-09 DIAGNOSIS — Z794 Long term (current) use of insulin: Secondary | ICD-10-CM | POA: Diagnosis not present

## 2024-02-09 DIAGNOSIS — E782 Mixed hyperlipidemia: Secondary | ICD-10-CM | POA: Diagnosis not present

## 2024-02-09 DIAGNOSIS — I10 Essential (primary) hypertension: Secondary | ICD-10-CM | POA: Diagnosis not present

## 2024-02-21 ENCOUNTER — Other Ambulatory Visit: Payer: Self-pay | Admitting: Cardiovascular Disease

## 2024-02-24 ENCOUNTER — Telehealth: Payer: Self-pay | Admitting: Cardiovascular Disease

## 2024-02-24 MED ORDER — ISOSORBIDE MONONITRATE ER 30 MG PO TB24: 30.0000 mg | ORAL_TABLET | Freq: Every day | ORAL | 0 refills | Status: DC

## 2024-02-24 NOTE — Telephone Encounter (Signed)
*  STAT* If patient is at the pharmacy, call can be transferred to refill team.   1. Which medications need to be refilled? (please list name of each medication and dose if known) isosorbide  mononitrate (IMDUR ) 30 MG 24 hr tablet    2. Would you like to learn more about the convenience, safety, & potential cost savings by using the [facility]?    3. Are you open to using the [facility] (Type [facility].  ).   4. Which pharmacy/location (including street and city if local pharmacy) is medication to be sent to? [id number] - [place], [place] - [address]    5. Do they need a 30 day or 90 day supply? 90 day

## 2024-02-24 NOTE — Telephone Encounter (Signed)
 Pt's medication was sent to pt's pharmacy as requested. Confirmation received.

## 2024-03-17 ENCOUNTER — Encounter: Payer: Self-pay | Admitting: *Deleted

## 2024-03-17 ENCOUNTER — Other Ambulatory Visit: Payer: Self-pay | Admitting: Cardiovascular Disease

## 2024-03-25 NOTE — Progress Notes (Unsigned)
 Cardiology Office Note  Date:  03/26/2024   ID:  Ricky Moore, Ricky Moore May 07, 1976, MRN [id number]  PCP:  Bon[place], [place]  E, PA-C   Chief Complaint  Patient presents with   12 month follow up     Doing well.     HPI:  Mr. Corporan is a 48 y.o. male  with past medical history of Cardiomyopathy, hypertensive heart disease Coronary artery disease, Chronic kidney disease stage III emergency room November 16, 2020  atypical chest pain Echocardiogram ejection fraction 35 to 40% chest pain at home, reevaluated November 27, 2020, right left heart catheterization 90% mid LAD stenosis with total occlusions of OM1, posterolateral branch #2, underwent PCI of the mid LAD Echo 8/22: EF 45 to 50% Who presents for routine follow-up in the clinic for his coronary disease, cardiomyopathy  Last seen by myself in clinic January 2024  Checks BP at home Reports diastolic pressure typically 100 up to 110 Same numbers as today 130/100 Compliant with his medications Taking Coreg  25 twice daily losartan  100 daily isosorbide  30 daily amlodipine  10 daily Again reports he is not taking aspirin  or Plavix   Landscaping in the yard yesterday Diaphoretic and dizzy in the office this morning after taking all his pills with no breakfast  Prior history of dizziness on Entresto  Denies significant chest pain or shortness of breath on exertion  Lab work reviewed Total cholesterol 140 LDL 81 Creatinine 1.54 A1c 6.4  EKG personally reviewed by myself on todays visit EKG Interpretation Date/Time:  Friday March 26 2024 08:08:07 EDT Ventricular Rate:  81 PR Interval:  188 QRS Duration:  94 QT Interval:  382 QTC Calculation: 443 R Axis:   -17  Text Interpretation: Normal sinus rhythm Minimal voltage criteria for LVH, may be normal variant ( R in aVL ) Inferior infarct (cited on or before 28-Nov-2020) When compared with ECG of 28-Nov-2020 04:39, No significant change was found Confirmed by Perla Lye [phone]) on  03/26/2024 8:39:04 AM    PMH:   has a past medical history of Bladder tumor, Complication of anesthesia, Diabetes mellitus without complication (HCC), GERD (gastroesophageal reflux disease), Hematuria, Hypertension, Sickle cell trait (HCC), and Sleep apnea.  PSH:    Past Surgical History:  Procedure Laterality Date   CARDIAC CATHETERIZATION     CORONARY STENT INTERVENTION N/A 11/27/2020   Procedure: CORONARY STENT INTERVENTION;  Surgeon: Darron Deatrice LABOR, MD;  Location: [facility];  Service: Cardiovascular;  Laterality: N/A;   CYSTOSCOPY WITH RETROGRADE PYELOGRAM, URETEROSCOPY AND STENT PLACEMENT Bilateral 06/14/2015   Procedure: CYSTOSCOPY WITH BIOPSY, FULGERATION,  BILATERAL RETROGRADE PYELOGRAM;  Surgeon: Ricardo Likens, MD;  [facility];  Service: Urology;  Laterality: Bilateral;   RIGHT/LEFT HEART CATH AND CORONARY ANGIOGRAPHY N/A 11/27/2020   Procedure: RIGHT/LEFT HEART CATH AND CORONARY ANGIOGRAPHY;  Surgeon: Darron Deatrice LABOR, MD;  Location: [facility];  Service: Cardiovascular;  Laterality: N/A;   TONSILLECTOMY  2008   TRANSURETHRAL RESECTION OF BLADDER TUMOR WITH GYRUS (TURBT-GYRUS)  2010   VASECTOMY Bilateral 06/14/2015   Procedure: VASECTOMY;  Surgeon: Ricardo Likens, MD;  [facility];  Service: Urology;  Laterality: Bilateral;    Current Outpatient Medications  Medication Sig Dispense Refill   amLODipine  (NORVASC ) 10 MG tablet Take 10 mg by mouth daily.     atorvastatin  (LIPITOR) 40 MG tablet TAKE 1 TABLET BY MOUTH EVERY DAY 30 tablet 0   carvedilol  (COREG ) 25 MG tablet Take 1 tablet (25 mg total) by mouth 2 (two) times daily.  180 tablet 3   gabapentin  (NEURONTIN ) 300 MG capsule Take 300 mg by mouth at bedtime as needed.     isosorbide  mononitrate (IMDUR ) 30 MG 24 hr tablet Take 1 tablet (30 mg total) by mouth daily. 30 tablet 0   JARDIANCE  25 MG TABS tablet Take 25 mg by mouth daily.     losartan  (COZAAR ) 100 MG tablet Take by mouth daily.      nitroGLYCERIN  (NITROSTAT ) 0.4 MG SL tablet Place 1 tablet (0.4 mg total) under the tongue every 5 (five) minutes x 3 doses as needed for chest pain. 15 tablet 12   TRULICITY  3 MG/0.5ML SOPN Inject 3 mg into the skin once a week.     No current facility-administered medications for this visit.    Allergies:   Lisinopril , Mixed ragweed, Molds & smuts, and Other   Social History:  The patient  reports that he has never smoked. He has never used smokeless tobacco. He reports current alcohol use. He reports that he does not use drugs.   Family History:   family history includes Diabetes Mellitus II in his maternal aunt; Hypertension in his mother.    Review of Systems: Review of Systems  Constitutional: Negative.   HENT: Negative.    Respiratory: Negative.    Cardiovascular: Negative.   Gastrointestinal: Negative.   Musculoskeletal: Negative.   Neurological: Negative.   Psychiatric/Behavioral: Negative.    All other systems reviewed and are negative.   PHYSICAL EXAM: VS:  BP (!) 130/100 (BP Location: Left Arm, Patient Position: Sitting, Cuff Size: Large)   Pulse 81   Ht 6' 3 (1.905 m)   Wt (!) 325 lb 4 oz (147.5 kg)   SpO2 97%   BMI 40.65 kg/m  , BMI Body mass index is 40.65 kg/m. Constitutional:  oriented to person, place, and time. No distress.  HENT:  Head: Grossly normal Eyes:  no discharge. No scleral icterus.  Neck: No JVD, no carotid bruits  Cardiovascular: Regular rate and rhythm, no murmurs appreciated Pulmonary/Chest: Clear to auscultation bilaterally, no wheezes or rails Abdominal: Soft.  no distension.  no tenderness.  Musculoskeletal: Normal range of motion Neurological:  normal muscle tone. Coordination normal. No atrophy Skin: Skin warm and dry Psychiatric: normal affect, pleasant  Recent Labs: No results found for requested labs within last 365 days.    Lipid Panel Lab Results  Component Value Date   CHOL 135 05/18/2021   HDL 44 05/18/2021    LDLCALC 79 05/18/2021   TRIG 58 05/18/2021      Wt Readings from Last 3 Encounters:  03/26/24 (!) 325 lb 4 oz (147.5 kg)  10/01/22 (!) 338 lb 2 oz (153.4 kg)  02/06/22 (!) 335 lb 2 oz (152 kg)     ASSESSMENT AND PLAN:  Problem List Items Addressed This Visit       Cardiology Problems   Dilated cardiomyopathy (HCC)   Relevant Orders   EKG 12-Lead (Completed)   Hyperlipidemia associated with type 2 diabetes mellitus (HCC)   Chronic systolic heart failure (HCC)   Relevant Orders   EKG 12-Lead (Completed)     Other   Type 2 diabetes mellitus with diabetic polyneuropathy, with long-term current use of insulin  (HCC)   OSA on CPAP   Morbid obesity (HCC)   Other Visit Diagnoses       Coronary artery disease involving native coronary artery of native heart without angina pectoris    -  Primary   Relevant Orders   EKG  12-Lead (Completed)      Cardiomyopathy On carvedilol  25 twice daily, continue losartan  100 daily, isosorbide  up to 30 twice daily for elevated diastolic pressures Ideally would like to get him off amlodipine  given low ejection fraction but will titrate off slowly as blood pressure improves Remains on Jardiance  25 daily Likely mild hypovolemia, took her blood pressure medications this morning on empty stomach, dizzy, diaphoretic requiring cups of water , snack  Diabetes type 2 with complications A1c improving 6.4  Essential hypertension Diastolic pressure elevated, isosorbide  up to 30 twice daily  Coronary disease with stable angina Currently with no symptoms of angina. No further workup at this time.  Add Zetia with Lipitor 40  Hyperlipidemia Continue Lipitor 40 daily, add Zetia 10 daily  Signed, Tim Jalie Eiland, M.D., Ph.D. [facility] [place], [place] [phone]

## 2024-03-26 ENCOUNTER — Encounter: Payer: Self-pay | Admitting: Cardiovascular Disease

## 2024-03-26 ENCOUNTER — Ambulatory Visit: Attending: Cardiovascular Disease | Admitting: Cardiovascular Disease

## 2024-03-26 VITALS — BP 130/100 | HR 81 | Ht 75.0 in | Wt 325.2 lb

## 2024-03-26 DIAGNOSIS — E1142 Type 2 diabetes mellitus with diabetic polyneuropathy: Secondary | ICD-10-CM

## 2024-03-26 DIAGNOSIS — G4733 Obstructive sleep apnea (adult) (pediatric): Secondary | ICD-10-CM

## 2024-03-26 DIAGNOSIS — I420 Dilated cardiomyopathy: Secondary | ICD-10-CM | POA: Diagnosis not present

## 2024-03-26 DIAGNOSIS — Z794 Long term (current) use of insulin: Secondary | ICD-10-CM

## 2024-03-26 DIAGNOSIS — E785 Hyperlipidemia, unspecified: Secondary | ICD-10-CM

## 2024-03-26 DIAGNOSIS — I5022 Chronic systolic (congestive) heart failure: Secondary | ICD-10-CM

## 2024-03-26 DIAGNOSIS — E1169 Type 2 diabetes mellitus with other specified complication: Secondary | ICD-10-CM | POA: Diagnosis not present

## 2024-03-26 DIAGNOSIS — I2510 Atherosclerotic heart disease of native coronary artery without angina pectoris: Secondary | ICD-10-CM | POA: Diagnosis not present

## 2024-03-26 MED ORDER — ISOSORBIDE MONONITRATE ER 30 MG PO TB24: 30.0000 mg | ORAL_TABLET | Freq: Two times a day (BID) | ORAL | 3 refills | Status: AC

## 2024-03-26 MED ORDER — EZETIMIBE 10 MG PO TABS: 10.0000 mg | ORAL_TABLET | Freq: Every day | ORAL | 3 refills | Status: AC

## 2024-03-26 MED ORDER — ISOSORBIDE MONONITRATE ER 30 MG PO TB24: 30.0000 mg | ORAL_TABLET | Freq: Every day | ORAL | 3 refills | Status: DC

## 2024-03-26 MED ORDER — ASPIRIN 81 MG PO TBEC: 81.0000 mg | DELAYED_RELEASE_TABLET | Freq: Every day | ORAL | Status: AC

## 2024-03-26 NOTE — Patient Instructions (Addendum)
 Medication Instructions:   Please start zetia 10 mg daily for cholesterol  Please start isosorbide  30 mg twice a day for blood pressure  If you need a refill on your cardiac medications before your next appointment, please call your pharmacy.   Lab work: No new labs needed  Testing/Procedures: No new testing needed  Follow-Up: At [facility], you and your health needs are our priority.  As part of our continuing mission to provide you with exceptional heart care, we have created designated Provider Care Teams.  These Care Teams include your primary Cardiologist (physician) and Advanced Practice Providers (APPs -  Physician Assistants and Nurse Practitioners) who all work together to provide you with the care you need, when you need it.  You will need a follow up appointment in 12 months  Providers on your designated Care Team:   Lonni Meager, NP Bernardino Bring, PA-C Cadence [place], [place]  COVID-19 Vaccine Information can be found at: [email or web address] For questions related to vaccine distribution or appointments, please email [email or web address] or call [phone].

## 2024-04-06 DIAGNOSIS — R8289 Other abnormal findings on cytological and histological examination of urine: Secondary | ICD-10-CM | POA: Diagnosis not present

## 2024-04-06 DIAGNOSIS — Z87448 Personal history of other diseases of urinary system: Secondary | ICD-10-CM | POA: Diagnosis not present

## 2024-04-06 DIAGNOSIS — R81 Glycosuria: Secondary | ICD-10-CM | POA: Diagnosis not present

## 2024-04-06 DIAGNOSIS — R3912 Poor urinary stream: Secondary | ICD-10-CM | POA: Diagnosis not present

## 2024-04-06 DIAGNOSIS — N401 Enlarged prostate with lower urinary tract symptoms: Secondary | ICD-10-CM | POA: Diagnosis not present

## 2024-05-16 ENCOUNTER — Other Ambulatory Visit: Payer: Self-pay | Admitting: Cardiovascular Disease

## 2024-07-25 DIAGNOSIS — J101 Influenza due to other identified influenza virus with other respiratory manifestations: Secondary | ICD-10-CM | POA: Diagnosis not present

## 2024-08-14 ENCOUNTER — Other Ambulatory Visit: Payer: Self-pay | Admitting: Cardiovascular Disease
